# Patient Record
Sex: Female | Born: 1980 | Race: White | Hispanic: No | Marital: Single | Smoking: Current every day smoker
Health system: Southern US, Community
[De-identification: ages and names within clinical notes are randomized; demographics above are authoritative.]

## PROBLEM LIST (undated history)

## (undated) DIAGNOSIS — K811 Chronic cholecystitis: Secondary | ICD-10-CM

## (undated) DIAGNOSIS — F1120 Opioid dependence, uncomplicated: Secondary | ICD-10-CM

## (undated) DIAGNOSIS — Z87440 Personal history of urinary (tract) infections: Secondary | ICD-10-CM

## (undated) DIAGNOSIS — Z809 Family history of malignant neoplasm, unspecified: Secondary | ICD-10-CM

## (undated) DIAGNOSIS — N390 Urinary tract infection, site not specified: Secondary | ICD-10-CM

## (undated) DIAGNOSIS — B379 Candidiasis, unspecified: Secondary | ICD-10-CM

## (undated) DIAGNOSIS — F419 Anxiety disorder, unspecified: Secondary | ICD-10-CM

## (undated) DIAGNOSIS — Z8619 Personal history of other infectious and parasitic diseases: Secondary | ICD-10-CM

## (undated) DIAGNOSIS — R569 Unspecified convulsions: Secondary | ICD-10-CM

## (undated) DIAGNOSIS — Z8249 Family history of ischemic heart disease and other diseases of the circulatory system: Secondary | ICD-10-CM

## (undated) DIAGNOSIS — O139 Gestational [pregnancy-induced] hypertension without significant proteinuria, unspecified trimester: Secondary | ICD-10-CM

## (undated) DIAGNOSIS — F329 Major depressive disorder, single episode, unspecified: Secondary | ICD-10-CM

## (undated) DIAGNOSIS — IMO0002 Reserved for concepts with insufficient information to code with codable children: Secondary | ICD-10-CM

## (undated) DIAGNOSIS — F32A Depression, unspecified: Secondary | ICD-10-CM

## (undated) DIAGNOSIS — N871 Moderate cervical dysplasia: Secondary | ICD-10-CM

## (undated) DIAGNOSIS — O99320 Drug use complicating pregnancy, unspecified trimester: Secondary | ICD-10-CM

## (undated) DIAGNOSIS — F1910 Other psychoactive substance abuse, uncomplicated: Secondary | ICD-10-CM

## (undated) DIAGNOSIS — N870 Mild cervical dysplasia: Secondary | ICD-10-CM

## (undated) DIAGNOSIS — Z833 Family history of diabetes mellitus: Secondary | ICD-10-CM

## (undated) HISTORY — DX: Family history of malignant neoplasm, unspecified: Z80.9

## (undated) HISTORY — DX: Gestational (pregnancy-induced) hypertension without significant proteinuria, unspecified trimester: O13.9

## (undated) HISTORY — DX: Anxiety disorder, unspecified: F41.9

## (undated) HISTORY — DX: Moderate cervical dysplasia: N87.1

## (undated) HISTORY — DX: Depression, unspecified: F32.A

## (undated) HISTORY — DX: Unspecified convulsions: R56.9

## (undated) HISTORY — PX: WISDOM TOOTH EXTRACTION: SHX21

## (undated) HISTORY — DX: Candidiasis, unspecified: B37.9

## (undated) HISTORY — DX: Urinary tract infection, site not specified: N39.0

## (undated) HISTORY — DX: Personal history of urinary (tract) infections: Z87.440

## (undated) HISTORY — DX: Family history of ischemic heart disease and other diseases of the circulatory system: Z82.49

## (undated) HISTORY — DX: Reserved for concepts with insufficient information to code with codable children: IMO0002

## (undated) HISTORY — DX: Personal history of other infectious and parasitic diseases: Z86.19

## (undated) HISTORY — PX: DILATION AND CURETTAGE OF UTERUS: SHX78

## (undated) HISTORY — DX: Major depressive disorder, single episode, unspecified: F32.9

## (undated) HISTORY — DX: Other psychoactive substance abuse, uncomplicated: F19.10

## (undated) HISTORY — DX: Family history of diabetes mellitus: Z83.3

## (undated) HISTORY — DX: Mild cervical dysplasia: N87.0

## (undated) HISTORY — DX: Chronic cholecystitis: K81.1

---
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
## 2003-05-04 ENCOUNTER — Emergency Department (HOSPITAL_COMMUNITY): Admission: EM | Admit: 2003-05-04 | Discharge: 2003-05-04 | Payer: Self-pay | Admitting: Emergency Medicine

## 2003-08-09 DIAGNOSIS — Z8619 Personal history of other infectious and parasitic diseases: Secondary | ICD-10-CM

## 2003-08-09 DIAGNOSIS — O139 Gestational [pregnancy-induced] hypertension without significant proteinuria, unspecified trimester: Secondary | ICD-10-CM

## 2003-08-09 HISTORY — DX: Personal history of other infectious and parasitic diseases: Z86.19

## 2003-08-09 HISTORY — DX: Gestational (pregnancy-induced) hypertension without significant proteinuria, unspecified trimester: O13.9

## 2003-10-30 ENCOUNTER — Other Ambulatory Visit: Admission: RE | Admit: 2003-10-30 | Discharge: 2003-10-30 | Payer: Self-pay | Admitting: Obstetrics and Gynecology

## 2004-04-04 ENCOUNTER — Ambulatory Visit: Payer: Self-pay | Admitting: Obstetrics and Gynecology

## 2004-05-12 ENCOUNTER — Inpatient Hospital Stay (HOSPITAL_COMMUNITY): Admission: AD | Admit: 2004-05-12 | Discharge: 2004-05-16 | Payer: Self-pay | Admitting: *Deleted

## 2004-05-13 ENCOUNTER — Encounter (INDEPENDENT_AMBULATORY_CARE_PROVIDER_SITE_OTHER): Payer: Self-pay | Admitting: Specialist

## 2005-12-05 ENCOUNTER — Emergency Department (HOSPITAL_COMMUNITY): Admission: EM | Admit: 2005-12-05 | Discharge: 2005-12-05 | Payer: Self-pay | Admitting: Emergency Medicine

## 2006-01-27 ENCOUNTER — Other Ambulatory Visit: Admission: RE | Admit: 2006-01-27 | Discharge: 2006-01-27 | Payer: Self-pay | Admitting: Obstetrics and Gynecology

## 2006-02-24 ENCOUNTER — Ambulatory Visit (HOSPITAL_COMMUNITY): Admission: RE | Admit: 2006-02-24 | Discharge: 2006-02-24 | Payer: Self-pay | Admitting: Obstetrics and Gynecology

## 2006-02-24 ENCOUNTER — Encounter (INDEPENDENT_AMBULATORY_CARE_PROVIDER_SITE_OTHER): Payer: Self-pay | Admitting: Specialist

## 2006-08-08 DIAGNOSIS — IMO0002 Reserved for concepts with insufficient information to code with codable children: Secondary | ICD-10-CM

## 2006-08-08 DIAGNOSIS — R87619 Unspecified abnormal cytological findings in specimens from cervix uteri: Secondary | ICD-10-CM

## 2006-08-08 HISTORY — DX: Reserved for concepts with insufficient information to code with codable children: IMO0002

## 2006-08-08 HISTORY — DX: Unspecified abnormal cytological findings in specimens from cervix uteri: R87.619

## 2006-09-14 ENCOUNTER — Inpatient Hospital Stay (HOSPITAL_COMMUNITY): Admission: AD | Admit: 2006-09-14 | Discharge: 2006-09-14 | Payer: Self-pay | Admitting: Obstetrics and Gynecology

## 2006-10-26 ENCOUNTER — Ambulatory Visit (HOSPITAL_COMMUNITY): Admission: RE | Admit: 2006-10-26 | Discharge: 2006-10-26 | Payer: Self-pay | Admitting: Obstetrics and Gynecology

## 2006-11-14 ENCOUNTER — Ambulatory Visit (HOSPITAL_COMMUNITY): Admission: RE | Admit: 2006-11-14 | Discharge: 2006-11-14 | Payer: Self-pay | Admitting: Obstetrics and Gynecology

## 2006-12-13 ENCOUNTER — Ambulatory Visit (HOSPITAL_COMMUNITY): Admission: RE | Admit: 2006-12-13 | Discharge: 2006-12-13 | Payer: Self-pay | Admitting: Obstetrics and Gynecology

## 2007-01-10 ENCOUNTER — Ambulatory Visit (HOSPITAL_COMMUNITY): Admission: RE | Admit: 2007-01-10 | Discharge: 2007-01-10 | Payer: Self-pay | Admitting: Obstetrics and Gynecology

## 2007-02-07 ENCOUNTER — Ambulatory Visit (HOSPITAL_COMMUNITY): Admission: RE | Admit: 2007-02-07 | Discharge: 2007-02-07 | Payer: Self-pay | Admitting: Obstetrics and Gynecology

## 2007-02-28 ENCOUNTER — Inpatient Hospital Stay (HOSPITAL_COMMUNITY): Admission: AD | Admit: 2007-02-28 | Discharge: 2007-03-03 | Payer: Self-pay | Admitting: Obstetrics and Gynecology

## 2007-03-01 ENCOUNTER — Encounter (INDEPENDENT_AMBULATORY_CARE_PROVIDER_SITE_OTHER): Payer: Self-pay | Admitting: Obstetrics and Gynecology

## 2007-03-01 ENCOUNTER — Encounter: Payer: Self-pay | Admitting: Obstetrics and Gynecology

## 2007-06-29 ENCOUNTER — Ambulatory Visit (HOSPITAL_COMMUNITY): Admission: RE | Admit: 2007-06-29 | Discharge: 2007-06-29 | Payer: Self-pay | Admitting: Obstetrics and Gynecology

## 2007-06-29 ENCOUNTER — Encounter (INDEPENDENT_AMBULATORY_CARE_PROVIDER_SITE_OTHER): Payer: Self-pay | Admitting: Obstetrics and Gynecology

## 2007-06-29 HISTORY — PX: LEEP: SHX91

## 2007-09-21 ENCOUNTER — Emergency Department (HOSPITAL_COMMUNITY): Admission: EM | Admit: 2007-09-21 | Discharge: 2007-09-21 | Payer: Self-pay | Admitting: Emergency Medicine

## 2008-06-04 IMAGING — CR DG ABDOMEN 1V
2 series · 2 of 2 positions shown · non-contrast
Comparison: None

CLINICAL DATA: Right sided pain.
 ABDOMEN ? 2 VIEW:

[t abdomen supine (1 of 2)]
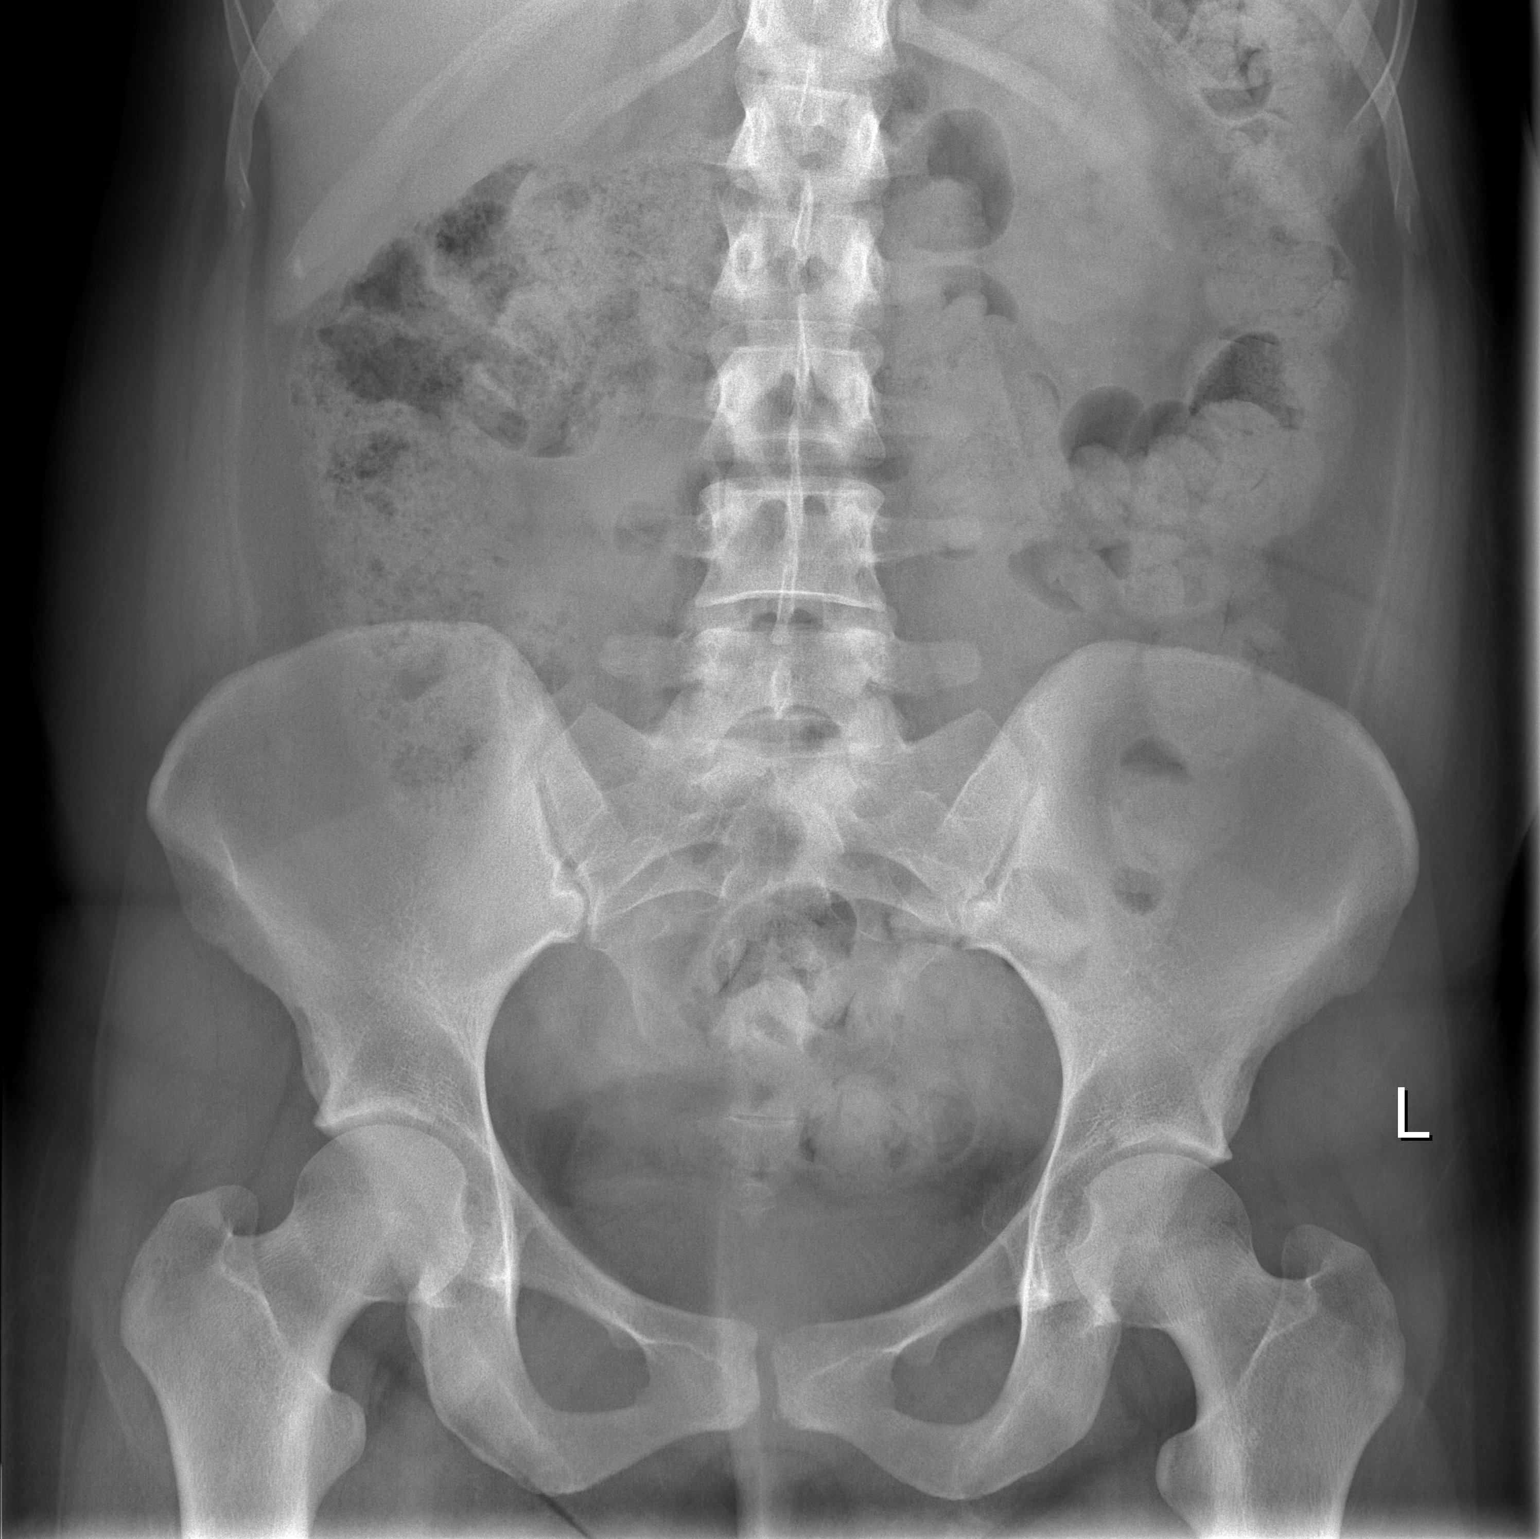

[t abdomen supine (2 of 2)]
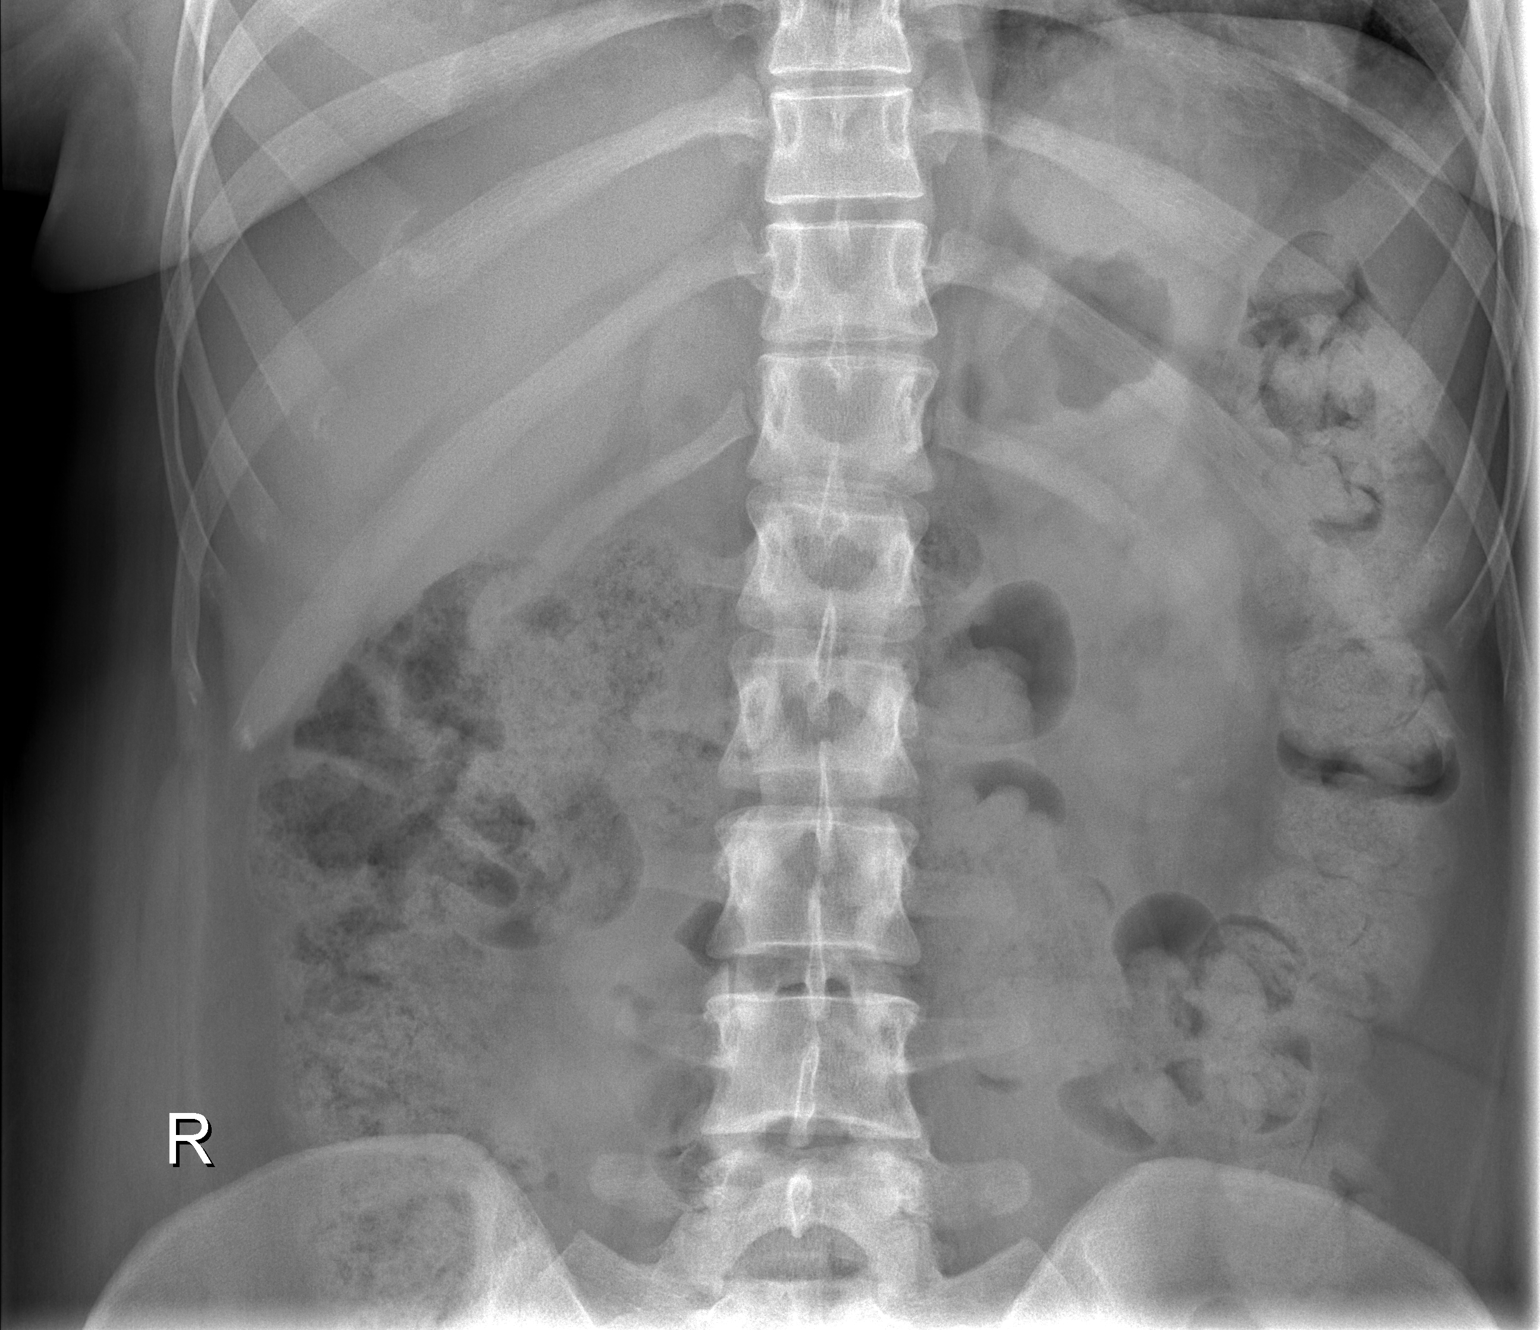

[2 of 2 positions shown; findings below may reference images not displayed]

FINDINGS: Two views of the abdomen show a nonspecific, non-obstructive bowel gas pattern.  Moderate stool noted throughout the colon.
IMPRESSION: Nonspecific, non-obstructive bowel gas pattern.  Colonic stool noted.

## 2010-08-29 ENCOUNTER — Encounter: Payer: Self-pay | Admitting: Obstetrics and Gynecology

## 2010-10-13 ENCOUNTER — Emergency Department (HOSPITAL_COMMUNITY)
Admission: EM | Admit: 2010-10-13 | Discharge: 2010-10-13 | Discharge status: Against Medical Advice | Payer: Medicaid Other | Attending: Emergency Medicine | Admitting: Emergency Medicine

## 2010-10-13 DIAGNOSIS — M79609 Pain in unspecified limb: Secondary | ICD-10-CM | POA: Insufficient documentation

## 2010-10-13 DIAGNOSIS — R350 Frequency of micturition: Secondary | ICD-10-CM | POA: Insufficient documentation

## 2010-10-13 DIAGNOSIS — R109 Unspecified abdominal pain: Secondary | ICD-10-CM | POA: Insufficient documentation

## 2010-10-13 LAB — URINALYSIS, ROUTINE W REFLEX MICROSCOPIC
Glucose, UA: NEGATIVE mg/dL
Hgb urine dipstick: NEGATIVE
Ketones, ur: 15 mg/dL — AB
Nitrite: POSITIVE — AB
Protein, ur: NEGATIVE mg/dL
Specific Gravity, Urine: 1.026 (ref 1.005–1.030)
Urobilinogen, UA: 1 mg/dL (ref 0.0–1.0)
pH: 5.5 (ref 5.0–8.0)

## 2010-10-13 LAB — URINE MICROSCOPIC-ADD ON

## 2010-10-15 ENCOUNTER — Emergency Department (HOSPITAL_COMMUNITY)
Admission: EM | Admit: 2010-10-15 | Discharge: 2010-10-15 | Disposition: A | Discharge status: Home / Self Care | Payer: Medicaid Other | Attending: Emergency Medicine | Admitting: Emergency Medicine

## 2010-10-15 ENCOUNTER — Emergency Department (HOSPITAL_COMMUNITY): Payer: Medicaid Other

## 2010-10-15 DIAGNOSIS — K8020 Calculus of gallbladder without cholecystitis without obstruction: Secondary | ICD-10-CM | POA: Insufficient documentation

## 2010-10-15 DIAGNOSIS — F3289 Other specified depressive episodes: Secondary | ICD-10-CM | POA: Insufficient documentation

## 2010-10-15 DIAGNOSIS — F329 Major depressive disorder, single episode, unspecified: Secondary | ICD-10-CM | POA: Insufficient documentation

## 2010-10-15 DIAGNOSIS — R109 Unspecified abdominal pain: Secondary | ICD-10-CM | POA: Insufficient documentation

## 2010-10-15 DIAGNOSIS — R7989 Other specified abnormal findings of blood chemistry: Secondary | ICD-10-CM | POA: Insufficient documentation

## 2010-10-15 LAB — COMPREHENSIVE METABOLIC PANEL
ALT: 60 U/L — ABNORMAL HIGH (ref 0–35)
AST: 51 U/L — ABNORMAL HIGH (ref 0–37)
Albumin: 4.4 g/dL (ref 3.5–5.2)
Alkaline Phosphatase: 74 U/L (ref 39–117)
BUN: 12 mg/dL (ref 6–23)
CO2: 25 mEq/L (ref 19–32)
Calcium: 9.4 mg/dL (ref 8.4–10.5)
Chloride: 103 mEq/L (ref 96–112)
Creatinine, Ser: 0.75 mg/dL (ref 0.4–1.2)
GFR calc Af Amer: >60 mL/min (ref >60)
GFR calc non Af Amer: >60 mL/min (ref >60)
Glucose, Bld: 98 mg/dL (ref 70–99)
Potassium: 3.3 mEq/L — ABNORMAL LOW (ref 3.5–5.1)
Sodium: 138 mEq/L (ref 135–145)
Total Bilirubin: 0.8 mg/dL (ref 0.3–1.2)
Total Protein: 7.9 g/dL (ref 6.0–8.3)

## 2010-10-15 LAB — URINALYSIS, ROUTINE W REFLEX MICROSCOPIC
Bilirubin Urine: NEGATIVE
Glucose, UA: NEGATIVE mg/dL
Hgb urine dipstick: NEGATIVE
Ketones, ur: NEGATIVE mg/dL
Nitrite: NEGATIVE
Protein, ur: NEGATIVE mg/dL
Specific Gravity, Urine: 1.026 (ref 1.005–1.030)
Urobilinogen, UA: 1 mg/dL (ref 0.0–1.0)
pH: 6.5 (ref 5.0–8.0)

## 2010-10-15 LAB — URINE MICROSCOPIC-ADD ON

## 2010-10-15 LAB — DIFFERENTIAL
Basophils Absolute: 0 10*3/uL (ref 0.0–0.1)
Basophils Relative: 0 % (ref 0–1)
Eosinophils Absolute: 0.1 10*3/uL (ref 0.0–0.7)
Eosinophils Relative: 1 % (ref 0–5)
Lymphocytes Relative: 17 % (ref 12–46)
Lymphs Abs: 1.9 10*3/uL (ref 0.7–4.0)
Monocytes Absolute: 0.6 10*3/uL (ref 0.1–1.0)
Monocytes Relative: 5 % (ref 3–12)
Neutro Abs: 8.3 10*3/uL — ABNORMAL HIGH (ref 1.7–7.7)
Neutrophils Relative %: 76 % (ref 43–77)

## 2010-10-15 LAB — CBC
HCT: 43.1 % (ref 36.0–46.0)
Hemoglobin: 14.6 g/dL (ref 12.0–15.0)
MCH: 29.5 pg (ref 26.0–34.0)
MCHC: 33.9 g/dL (ref 30.0–36.0)
MCV: 87.1 fL (ref 78.0–100.0)
Platelets: 207 10*3/uL (ref 150–400)
RBC: 4.95 MIL/uL (ref 3.87–5.11)
RDW: 12.3 % (ref 11.5–15.5)
WBC: 10.8 10*3/uL — ABNORMAL HIGH (ref 4.0–10.5)

## 2010-10-15 LAB — PREGNANCY, URINE: Preg Test, Ur: NEGATIVE

## 2010-11-04 ENCOUNTER — Encounter (HOSPITAL_COMMUNITY)
Admission: RE | Admit: 2010-11-04 | Discharge: 2010-11-04 | Disposition: A | Discharge status: Home / Self Care | Payer: Medicaid Other | Source: Ambulatory Visit | Attending: General Surgery | Admitting: General Surgery

## 2010-11-04 LAB — CBC
HCT: 40.6 % (ref 36.0–46.0)
Hemoglobin: 13.8 g/dL (ref 12.0–15.0)
MCH: 29.6 pg (ref 26.0–34.0)
MCHC: 34 g/dL (ref 30.0–36.0)
MCV: 87.1 fL (ref 78.0–100.0)
RDW: 13 % (ref 11.5–15.5)

## 2010-11-04 LAB — URINALYSIS, ROUTINE W REFLEX MICROSCOPIC
Glucose, UA: NEGATIVE mg/dL
Hgb urine dipstick: NEGATIVE
Ketones, ur: 15 mg/dL — AB
pH: 6 (ref 5.0–8.0)

## 2010-11-04 LAB — SURGICAL PCR SCREEN
MRSA, PCR: NEGATIVE
Staphylococcus aureus: NEGATIVE

## 2010-11-04 LAB — COMPREHENSIVE METABOLIC PANEL
ALT: 23 U/L (ref 0–35)
Albumin: 3.9 g/dL (ref 3.5–5.2)
Calcium: 9.3 mg/dL (ref 8.4–10.5)
Glucose, Bld: 91 mg/dL (ref 70–99)
Sodium: 139 mEq/L (ref 135–145)
Total Protein: 7.5 g/dL (ref 6.0–8.3)

## 2010-11-04 LAB — URINE MICROSCOPIC-ADD ON

## 2010-11-04 LAB — DIFFERENTIAL
Eosinophils Relative: 3 % (ref 0–5)
Lymphocytes Relative: 40 % (ref 12–46)
Monocytes Absolute: 0.4 10*3/uL (ref 0.1–1.0)
Monocytes Relative: 5 % (ref 3–12)
Neutro Abs: 4 10*3/uL (ref 1.7–7.7)

## 2010-11-07 DIAGNOSIS — K811 Chronic cholecystitis: Secondary | ICD-10-CM

## 2010-11-07 HISTORY — DX: Chronic cholecystitis: K81.1

## 2010-11-09 ENCOUNTER — Ambulatory Visit (HOSPITAL_COMMUNITY)
Admission: RE | Admit: 2010-11-09 | Discharge: 2010-11-09 | Disposition: A | Discharge status: Home / Self Care | Payer: Medicaid Other | Source: Ambulatory Visit | Attending: General Surgery | Admitting: General Surgery

## 2010-11-09 ENCOUNTER — Other Ambulatory Visit: Payer: Self-pay | Admitting: General Surgery

## 2010-11-09 ENCOUNTER — Ambulatory Visit (HOSPITAL_COMMUNITY): Payer: Medicaid Other

## 2010-11-09 DIAGNOSIS — K8010 Calculus of gallbladder with chronic cholecystitis without obstruction: Secondary | ICD-10-CM | POA: Insufficient documentation

## 2010-11-09 DIAGNOSIS — Z01812 Encounter for preprocedural laboratory examination: Secondary | ICD-10-CM | POA: Insufficient documentation

## 2010-11-09 HISTORY — PX: LAPAROSCOPIC CHOLECYSTECTOMY W/ CHOLANGIOGRAPHY: SUR757

## 2010-11-16 NOTE — Op Note (Signed)
NAMESHARNICE, BOSLER              ACCOUNT NO.:  [id number]  MEDICAL RECORD NO.:  [id number]           PATIENT TYPE:  O  LOCATION:  SDSC                         FACILITY:  [facility]  PHYSICIAN:  Angelia Mould. Derrell Lolling, M.D.DATE OF BIRTH:  22-Oct-1980  DATE OF PROCEDURE:  11/09/2010 DATE OF DISCHARGE:  11/09/2010                              OPERATIVE REPORT   PREOPERATIVE DIAGNOSIS:  Chronic cholecystitis with cholelithiasis.  POSTOPERATIVE DIAGNOSIS:  Chronic cholecystitis with cholelithiasis.  OPERATION PERFORMED AT:  Laparoscopic cholecystectomy with intraoperative cholangiogram.  SURGEON:  Angelia Mould. Derrell Lolling, MD  FIRST ASSISTANT:  Currie Paris, MD  OPERATIVE INDICATIONS:  This is a 30 year old Caucasian female who began having episodes of biliary colic about a month ago.  This is described as right upper quadrant pain, right flank pain, right shoulder pain with associated nausea and vomiting.  It has been intermittent.  Recent ultrasound was performed at [facility], which showed multiple stones of the gallbladder, but no evidence of inflammation and the common bile duct measuring only 3.9 mm.  Liver function test showed mild elevation of SGPT and alkaline phosphatase.  Urine pregnancy test is negative.  She has been evaluated as an outpatient.  She really has not had any attacks for the past 10 days and is brought to the operating room electively.  OPERATIVE FINDINGS:  The gallbladder was chronically inflamed.  There were adhesions to the body and infundibulum of the gallbladder.  There was no evidence of any acute inflammation and the wall was really not that thick.  The anatomy of the cystic duct, cystic artery, and common bile duct were conventional.  The cholangiogram was normal, showing normal intrahepatic and extrahepatic biliary anatomy, no filling defects, no dilatation, and no obstruction with good flow of contrast into the duodenum.  The liver,  stomach, duodenum, small intestine, and large intestine were normal to inspection.  OPERATIVE TECHNIQUE:  Following the induction of general endotracheal anesthesia, the patient's abdomen was prepped and draped in sterile fashion.  Intravenous antibiotics were given.  Surgical time-out was held identifying correct patient and correct procedure.  Marcaine 0.5% with epinephrine were used as a local infiltration anesthetic.  A vertically-oriented incision was made at the lower rim of the umbilicus. The fascia was incised in the midline and the abdominal cavity entered under direct vision.  An 11-mm Hassan trocar was inserted and secured with pursestring suture of 0-Vicryl.  Pneumoperitoneum was created. Video cam was inserted with visualization and survey as findings above. An 11-mm trocar was placed in the subxiphoid region and two 5-mm trocars were placed in the right upper quadrant.  The gallbladder fundus was elevated.  Adhesions were taken down slowly.  We dissected out the cystic duct and the cystic artery.  The cystic artery was isolated as it went onto the wall of the gallbladder, was secured with multiple metal clips and divided.  A cholangiogram catheter was inserted in the cystic duct and a cholangiogram was obtained using the C-arm.  The cholangiogram was normal as described above.  The cholangiogram catheter was removed and the cystic duct was secured with multiple metal  clips and divided.  The gallbladder was dissected from its bed with electrocautery.  One small hole was made in the gallbladder and some green bile was spilled, but no stones were spilled.  This gallbladder was placed in the specimen bag and removed.  We irrigated the subphrenic space right paracolic gutter fairly extensively.  At the completion of the case, the irrigation fluid was completely clear.  There was no bleeding or bile leak whatsoever.  The trocars were removed under direct vision, and there was no  bleeding from the trocar sites.  The pneumoperitoneum was released.  The fascia at the umbilicus was closed with 0-Vicryl sutures.  The skin incisions were closed with subcuticular sutures of 4-0 Monocryl and Dermabond.  The patient tolerated the procedure well and was taken to the recovery room in a stable condition. Estimated blood loss was about 10 mL.  Complications were none.  Sponge, needle, and instrument counts were correct.     Angelia Mould. Derrell Lolling, M.D.     HMI/MEDQ  D:  11/09/2010  T:  11/10/2010  Job:  [id number]  cc:   Windle Guard, M.D. Junious Dresser, MD Osborn Coho, M.D.  Electronically Signed by Claud Kelp M.D. on 11/16/2010 06:45:27 AM

## 2010-12-10 ENCOUNTER — Encounter (INDEPENDENT_AMBULATORY_CARE_PROVIDER_SITE_OTHER): Payer: Self-pay | Admitting: General Surgery

## 2010-12-21 NOTE — Op Note (Signed)
NAME:  Jean [name], Jean [name]              ACCOUNT NO.:  [id number]   MEDICAL RECORD NO.:  [id number]          PATIENT TYPE:  OUT   LOCATION:  MFM                           [facility]   PHYSICIAN:  Hal Morales, M.D.DATE OF BIRTH:  [birth year]   DATE OF PROCEDURE:  03/01/2007  DATE OF DISCHARGE:                               OPERATIVE REPORT   PREOPERATIVE DIAGNOSES:  Intrauterine pregnancy at 19 weeks' gestation,  mature LS and PG, prior cesarean section with desire for repeat cesarean  section, nonreassuring antepartum testing, umbilical nevus, methadone  maintenance.   POSTOPERATIVE DIAGNOSES:  Intrauterine pregnancy at 39 weeks' gestation,  mature LS and PG, prior cesarean section with desire for repeat cesarean  section, nonreassuring antepartum testing, umbilical nevus, methadone  maintenance.  Plus occult cord prolapse.   PROCEDURE:  Repeat low transverse cesarean section and removal of  umbilical nevus.   SURGEON:  Dr. Dierdre Forth   FIRST ASSISTANT:  Erin Sons, certified nurse midwife.   ANESTHESIA:  Spinal.   ESTIMATED BLOOD LOSS:  750 mL.   COMPLICATIONS:  None.   FINDINGS:  The uterus, tubes and ovaries were normal for the gravid  state.  The placenta contained an eccentrically inserted three-vessel  cord.   FINDINGS:  The patient was delivered of a female infant name, Gearldine Bienenstock,  weighing 6 pounds 7 ounces with Apgars of 9 and 9 at 1 and 5 minutes  respectively.   PROCEDURE:  The patient is taken to the operating room after appropriate  identification and placed on the operating table.  After the attainment  of adequate spinal anesthesia she was placed in the supine position with  left lateral tilt.  The abdomen and perineum were prepped multiple  layers of Betadine and a Foley catheter inserted into the bladder under  sterile conditions and connected to straight drainage.  The abdomen was  draped as a sterile field.  A solution of quarter percent  Marcaine was  injected at the site of the previous cesarean section incision for total  of 20 mL.  A transverse incision was made at the site of the previous  cesarean section incision and the abdomen opened in layers.  Peritoneum  was entered and the bladder blade placed.  The uterus was incised  approximately 2 cm above the uterovesical fold and that incision taken  laterally on either side bluntly.  The infant was delivered from the  occiput transverse position and after having nares and pharynx  suctioned.  The cord clamped and cut was handed off to the awaiting  pediatricians.  The appropriate cord blood was drawn.  Placenta was  noted to have separated from the uterus and was removed from the  operative field.  The uterine incision was closed with a running  interlocking suture of 0 Vicryl and imbricating suture of 0 Vicryl was  placed.  Hemostasis was achieved with several figure-of-eight sutures of  0 Vicryl.  Copious irrigation was carried out.  The abdominal peritoneum  was closed with a running suture of 2-0 Vicryl.  The rectus muscles were  reapproximated in the midline  with figure-of-eight suture of 2-0 Vicryl.  The rectus fascia was closed with a running suture of 0-0 Vicryl and  reinforced on either side of midline with figure-of-eight sutures of 0  Vicryl.  The subcutaneous tissue was made hemostatic with Bovie cautery  and irrigated.  Skin incision was closed with a subcuticular suture of 3-  0 Monocryl.  The umbilical nevus was then grasped and elevated and a  suture of 2-0 Vicryl tied around the base.  The nevus was excised.  Steri-Strips were applied to the skin incision.  A sterile dressing  applied.  The patient was taken from the operating room to the recovery  room in satisfactory condition having tolerated the procedure well with  sponge and instrument counts correct.   SPECIMENS TO PATHOLOGY:  Placenta and nevus.      Hal Morales, M.D.  Electronically  Signed     VPH/MEDQ  D:  03/01/2007  T:  03/02/2007  Job:  [id number]

## 2010-12-21 NOTE — Discharge Summary (Signed)
Jean [name], Jean [name]              ACCOUNT NO.:  [id number]   MEDICAL RECORD NO.:  [id number]          PATIENT TYPE:  INP   LOCATION:  9145                          [facility]ILITY[facility]  [facility]   PHYSICIAN:  Crist Fat. Rivard, M.D. DATE OF BIRTH:  [birth year]   DATE OF ADMISSION:  02/28/2007  DATE OF DISCHARGE:  03/03/2007                               DISCHARGE SUMMARY   ADMISSION DIAGNOSES:  1. Intrauterine pregnancy at 35-5/7 weeks.  2. Nonreactive NST with BPP 6/8 for a total of 6/10.  3. 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  4. Previous cesarean section with plan for repeat as method of      delivery.   DISCHARGE DIAGNOSES:  1. Intrauterine pregnancy at [redacted] weeks gestation with mature LSNPG.  2. Prior cesarean section with desire for repeat cesarean section.  3. None reassuring antepartum testing.  4. Methadone maintenance.  5. Occult cord prolapse.   PROCEDURE:  Repeat low-transverse cesarean section and removal of  umbilical nevus.   HOSPITAL COURSE:  Jean [name] has a 30 year old Gravida 3, Para 1-0-1-1  who was admitted at 35-5/7 weeks from the office secondary to a  nonreactive NST, and BPP 6/8 without fetal breathing movement.  She was  reporting decreased fetal movement in the office; therefore, she was  sent to the hospital for observation and repeat of  the BPP on the  evening of February 28, 2007. The pregnancy has been followed by the [facility] service and has been remarkable for:  1. 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  2. Previous cesarean section with plan for repeat.  3. History of preeclampsia with first pregnancy.  4. History of seizure x1 in the past.  5. Depression on Zoloft 50 mg daily.  6. History of fetus with cystic hygroma with fetal demise in the first      trimester.  7. Human papillomavirus lesions.   The patient was initially  admitted for 23-hour observation to the  antenatal unit.  BPP on the evening of July 23 showed 6/8 with no fetal  breathing motions.  AFI was normal at 17.68 cm.  Electronic fetal  monitoring remained reassuring; and baby was active per patient.  Options were reviewed with the patient including amniocentesis for  possible early delivery versus repeating a BPP and possibly sending her  home with plans for continued antenatal testing.  After discussion with  maternal fetal medicine, it was recommended that the patient undergo  amniocentesis for lung maturity, and delivery if mature.  The patient  consented to the procedure which was performed on the morning of March 01, 2007.  Results of the procedure showed PG positive.  LS ratio 3:1  and the decision was made to proceed with cesarean section.   The patient tolerated the procedure well.  She underwent a repeat low-  transverse cesarean section, as well as removal of umbilical nevus.  Infant was a viable female named Gearldine Bienenstock, weighing 6 pounds and  7 ounces  with Apgars of 9 and 9.  Infant was taken to the central nursery in good  condition.  The patient tolerated the rest of the procedure, and was  taken to the recovery room and was doing well.   By postop day #1 she was feeling well, reporting that pain was well  controlled.  She was bottle feeding, hemoglobin was 11.2 and had been  12.6 preoperatively.  During the night of postop day #1 she required a  couple of doses of Percocet for pain management.  She reported that the  Darvocet and Dilaudid were not managing her pain well.  However, by the  morning of postop day #2 she was desiring to go home.  Infant had been  discharged already.  Patient felt that the combination of Motrin and  Dilaudid would be sufficient for pain management at home.   She was deemed to have received the full benefit of her hospital stay.   DISCHARGE INSTRUCTIONS:  Per [facility] handout.   DISCHARGE  MEDICATIONS:  1. Motrin 600 mg one p.o. q.6 h. around-the-clock x5 days and then      p.r.n.  2. Dilaudid 2 mg one p.o. q.6 h. p.r.n. pain, dispensed #40 with no      refills.   DISCHARGE FOLLOWUP:  Will occur at [facility] in 4-6 weeks  or as needed.      Cam Hai, C.N.M.      Crist Fat Rivard, M.D.  Electronically Signed    KS/MEDQ  D:  03/03/2007  T:  03/04/2007  Job:  [id number]

## 2010-12-21 NOTE — Op Note (Signed)
Jean [name], Jean [name]              ACCOUNT NO.:  [id number]   MEDICAL RECORD NO.:  [id number]          PATIENT TYPE:  AMB   LOCATION:  SDC                           [facility]   PHYSICIAN:  Osborn Coho, M.D.   DATE OF BIRTH:  [birth year]   DATE OF PROCEDURE:  06/29/2007  DATE OF DISCHARGE:                               OPERATIVE REPORT   PREOPERATIVE DIAGNOSIS:  Cervical intraepithelial neoplasia 1 and 2.   POSTOPERATIVE DIAGNOSIS:  Cervical intraepithelial neoplasia 1 and 2.   PROCEDURE:  LEEP.   ATTENDING SURGEON:  Osborn Coho, M.D.   ANESTHESIA:  General via LMA.   SPECIMENS TO PATHOLOGY:  Cervical LEEP specimen.   FINDINGS:  Acetowhite lesion at 10 to 2 o'clock and 5 and 6 o'clock.   FLUIDS:  300 mL.   URINE OUTPUT:  Voided prior to procedure.   ESTIMATED BLOOD LOSS:  Minimal.   COMPLICATIONS:  None.   PROCEDURE:  The patient was taken to the operating room after the risks,  benefits, and alternatives were reviewed with the patient.  The patient  verbalized understanding and consent signed and witnessed.  The patient  was placed under general per anesthesia and prepped and draped in a  normal sterile fashion.   A bivalve speculum was placed in the patient's vagina and acetic acid  used to prep the cervix.  Acetowhite lesions were as noted above.  A  size 10-mm x 10-mm loop was used and LEEP specimen obtained.  Ball  cautery was used as well and good hemostasis noted.  Monsel's solution  was placed as well.  On the right vaginal wall, the loop touched the  vagina, and there was some bleeding at that area which was made  hemostatic with stitches of 3-0 Vicryl,  a running interlocking stitch.  Good hemostasis was noted.  A  paracervical block was administered using a total of 10 mL of 1%  lidocaine.  Count was correct.   The patient tolerated the procedure well and was awaiting transfer to  the recovery room in good condition.      Osborn Coho,  M.D.  Electronically Signed     AR/MEDQ  D:  06/29/2007  T:  06/29/2007  Job:  [id number]

## 2010-12-21 NOTE — H&P (Signed)
Jean [name], Jean [name]              ACCOUNT NO.:  [id number]   MEDICAL RECORD NO.:  [id number]          PATIENT TYPE:  INP   LOCATION:  9157                          [facility]   PHYSICIAN:  Naima A. Dillard, M.D. DATE OF BIRTH:  [birth year]   DATE OF ADMISSION:  02/28/2007  DATE OF DISCHARGE:                              HISTORY & PHYSICAL   HISTORY OF PRESENT ILLNESS:  [name] is a 30 year old gravida 3, para  1-0-1-1 at 35-5/7 weeks.  She presented from the office secondary to a  nonreactive NST and a BPP of 6 out of 8 without fetal breathing  movement.  She had reported decreased fetal movement today in the  office.  I placed her on the NST.  While at the office, fetal heart rate  was noted to be nonreactive.  BPP was 6 out of 8 with no fetal breathing  movements, giving a total of 6 out of 10 for antenatal testing.  She is  therefore to be admitted for observation and repeat BPP later the  evening of  February 28, 2007.  The patient pregnancy has been remarkable  for;  1. Being on methadone maintenance secondary to previous OxyContin      dependence.  Current dose is 180 mg p.o. daily. of methadone.  2. Previous cesarean section with plan for repeat on March 22, 2007.  3. History of pre-eclampsia with last pregnancy.  4. History of seizure x1 in the past, possibly secondary to OxyContin      abuse.  5. Depression, on Zoloft 50 mg daily.  6. History of fetus with cystic hygroma with fetal demise in the first      trimester and a D&E.  7. HPV lesions.  8. Abdominal itching, third trimester.   PRENATAL LABS:  Blood type is O-positive.  RH antibody negative.  VDRL  nonreactive.  Rubella titer positive.  Hepatitis-B surface antigen  negative.  HIV nonreactive in the first trimester.  Group B Strep  culture, GC and Chlamydia cultures were done in the office today.  The  patient had first trimester screening secondary to history of a fetus  with a cystic hygroma.  First trimester  screening was normal.  AFP was  done and was also normal.  The patient had a Glucola at 27 weeks with  normal findings.  She had a CBC, comprehensive metabolic panel and a 24  hour urine done during her pregnancy, secondary to her previous history  of pre-eclampsia; these were normal.  A 24 hour urine at 34 weeks showed  155 milligrams of protein.   HISTORY OF PRESENT PREGNANCY:  The patient entered care at approximately  8-10 weeks.  She had a first trimester ultrasound showing dating  criteria with [facility] of March 30, 2007.  She was already in drug rehab for  three months, secondary to methadone use for history of OxyContin  addiction.  she was on Zofran for severe nausea and vomiting.  She had a  first trimester screen which was normal secondary to history of fetus  with cystic hygroma.  She had maternal-fetal consult  for history of  cystic hygroma, questionable fetal sex of current medications and plan  of care.  From the very beginning of her pregnancy she did desire a  repeat C-section.  She had normal Glucola.  Did have some increased  weight gain during her pregnancy.  She began to use Phenergan every day  for chronic nausea.  She had a normal ultrasound at 18-20 weeks.  She  had normal AFP.  She had normal Glucola.  Good growth was noted on  ultrasound at 29 weeks.  She began to have some itching at approximately  33-34 weeks  She had another ultrasound at 34 weeks, showing good growth  with 66 percentile.  She had 1+ protein on the voided specimen at 34  weeks.  A 24 hour urine and PH labs were done; these were negative with  24 hour urine of 165 milligrams.  Bile salts were done secondary to  itching; these were negative.  She was seen in the office at 35 weeks  with a note of decreased fetal movement.  Her blood pressure was 128/80.  NST showed nonreactivity and a BPP was 6 out of 8 with no breathing  movements.  Therefore she was sent to [facility] for observation.  She  also had some new lesions on her vulva.  Genital warts were noted on  the right vulva and periclitoral area.  Decision was made to observe for  spontaneous regression postpartum.   OBSTETRICAL HISTORY:  In 2005 the patient had a primary low transverse  cesarean section for a female infant at [redacted] weeks gestation.  The baby  weighed 6 pounds, 7 ounces.  She had pre-eclampsia during that pregnancy  and was on magnesium sulfate. She progressed to 5-6 centimeters and had  a cord prolapse and meconium stained fluid.  She had epidural anesthesia  with that labor.  In July, 2007, she had a first trimester loss of a  fetus with a cystic hygroma.  She did have a D&E.   PAST MEDICAL HISTORY:  She had Chlamydia in 2005.  She has a history of  pyelo in the past.  She had a seizure in January, 2007; there was some  question as whether this was due to her OxyContin abuse.  She has  entries at [facility] for depression.  She has been on  Zoloft.  She also had been in drug rehab for approximately three months  prior to pregnancy for OxyContin addiction and was on methadone.   ALLERGIES:  The patient has no known medication allergies.   FAMILY HISTORY:  Her mother has hypertension.  Her maternal grandmother  has varicosities.  Her maternal grandmother has diabetes.   PAST SURGICAL HISTORY:  1. Previously noted C-section on 2005.  2. D&E in 2007.   GENETIC HISTORY:  Remarkable for her previous fetus with cystic hygroma.   SOCIAL HISTORY:  The patient is single.  The father of the baby has been  involved during this pregnancy; he is not presently with her today.  The  patient is Caucasian.  She is unemployed.  She lives with her mother.  She denies any alcohol or tobacco use during this pregnancy.  She has  been on methadone for at least three months prior to the onset of  pregnancy and now is on 180 milligrams p.o. daily.   PHYSICAL EXAMINATION:  VITAL SIGNS:  Vital signs are stable.   Patient is  afebrile.  HEENT:  Within normal limits.  LUNGS;  Breath sounds are clear.  HEART:  Regular rate and rhythm without murmur.  BREASTS:  Soft and nontender.  ABDOMEN:  Fundal height is approximately 38 centimeters.  Abdomen is  soft and nontender.  Fetus is in vertex presentation.  Fetal heart rate  initially on presentation was reassuring, however not classically  reactive.  There were cycles of reactivity, but there were also sporadic  mild variables at times.  There were some irregular mild uterine  contractions again initially on admission; these have now subsided.  The  patient was unaware of most of them.  Her abdomen was gravid and there  were scratch marks noted from patient excoriation, but it was soft and  nontender.  PELVIC EXAM:  Deferred at the hospital but HPV lesions were noted on the  right vulvar and periclitoral area while the patient was in the office.  EXTREMITIES:  Deep tendon reflexes are 2+ without clonus.  There is  trace edema noted.   IMPRESSION:  1. Intrauterine pregnancy at 35-5/7 weeks.  2. Nonreactive NST in the office with BPP of 6 out of 8 for a total of      6 out of 10.  3. 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  4. Previous cesarean section with plan for repeat at term.   PLAN:  1. Admit for 23 hour observation per Dr. Normand Sloop and Dr. Pennie Rushing.  2. Continue electronic fetal monitoring.  3. We are to repeat the BPP at approximately 7:00 p.m., which would be      at least six hours after the initial BPP in the office.  4. IV hydration.  5. Possible amnio is a potential plan, depending on fetal heart rate      tracings, however M.D.'s will determine if this is necessary.  6. M.D.'s will follow      Chip Boer L. Emilee Hero, C.N.M.      Naima A. Normand Sloop, M.D.  Electronically Signed    VLL/MEDQ  D:  03/01/2007  T:  03/01/2007  Job:  [id number]   cc:   Naima A. Normand Sloop, M.D.  Fax: [phone]

## 2010-12-21 NOTE — Op Note (Signed)
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   OPERATION:  Amniocentesis, limited ultrasound.   ANESTHESIA:  Local.   ESTIMATED BLOOD LOSS:  Less than 10 mL.   COMPLICATIONS:  None.   FINDINGS:  The amniotic fluid level was considered normal.  There is  vertex presentation.   SPECIMENS TO PATHOLOGY:  10 mL clear amniotic fluid sent to [facility] for LS and PG.   DESCRIPTION OF PROCEDURE:  The patient was in the supine position on her  hospital bed.  The fetal heart rate had been reactive prior to beginning  the amniocentesis.  The ultrasound was used to identify a pocket of  fluid that was near the infant's feet and located close to the umbilicus  approximately 3 cm to the left.  This area was cleansed with multiple  layers of Betadine and draped as a sterile field.  The skin was  infiltrated with 1% Xylocaine for total of 1 mL.  A 20-gauge spinal  needle was then used to access the amniotic fluid pocket and 10 mL of  clear amniotic fluid were withdrawn.  The needle was removed and the  patient placed on the fetal monitor for nonstress testing.  The fluid  was sent to laboratory for transport  to [facility].  The patient tolerated the procedure well.      Hal Morales, M.D.  Electronically Signed     VPH/MEDQ  D:  03/01/2007  T:  03/01/2007  Job:  [id number]

## 2010-12-24 NOTE — H&P (Signed)
NAMESHALECE, STAFFA              ACCOUNT NO.:  [id number]   MEDICAL RECORD NO.:  [id number]          PATIENT TYPE:  INP   LOCATION:  9165                          [facility]   PHYSICIAN:  Hal Morales, M.D.DATE OF BIRTH:  08/14/1980   DATE OF ADMISSION:  05/12/2004  DATE OF DISCHARGE:                                HISTORY & PHYSICAL   HISTORY OF PRESENT ILLNESS:  Ms. Hornung is a 30 year old gravida 1, para  0  at 40-1/7 weeks, EDD May 11, 2004, by date, confirmed with ultrasound at  10 weeks.  She presents from the office of [facility] with elevated blood  pressures and 3+ protein.  She reports positive fetal movement, no rupture  of membranes.  She noticed a bloody show at approximately 4 a.m. this  morning and has been having mild contractions since then which have become  stronger and more regular.  She is now contracting every 2-3 minutes.  She  has no active bleeding.  She denies any PIH symptoms.  No headaches, visual  changes, or epigastric pain.  Her pregnancy has been followed by the CNM  service at [facility] and is remarkable for:  1.  ?LMP.  2.  Smoker.  3.  History of anxiety.  4.  First trimester Chlamydia, test of cure was negative.  GC and Chlamydia      are negative at 36 weeks.  Group B Strep is negative at 36 weeks.  This patient began care at [facility] on October 08, 2003 at approximately nine  weeks' gestation, [facility] determined by 10 week, two day ultrasound and  confirmed with follow-up ultrasound.  She was identified as having Chlamydia  in the first trimester which was treated with Zithromax.  Test of cure three  weeks following treatment was negative and repeat of GC and Chlamydia  cultures at 36 weeks were both negative.  The patient was counseled on  smoking cessation; however, she has smoked throughout her pregnancy and was  taking Paxil at the beginning of pregnancy and switched to Zoloft which she  has continued and also has continued counseling at [facility].  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  Otherwise she has  been normotensive with no proteinuria until evaluation today on May 12, 2004.   OB HISTORY:  Present pregnancy.   PAST MEDICAL HISTORY:  Remarkable for anxiety.  She has been treated with  Paxil and currently is taking Zoloft.  She has been seen for counseling at  [facility].   FAMILY HISTORY:  The patient's mother has a history of hypertension on  medications.  Maternal grandmother, history of varicose veins.  Maternal  grandmother, diabetes.  The patient's paternal uncle has kidney disease.   GENETIC HISTORY:  Father of the baby's sister born with a hole in her heart,  although no medical treatment was necessary.   SOCIAL HISTORY:  Ms. Peragine is a 30 year old single  Caucasian female.  The  father of the baby, Flint Melter, is not involved.  The patient does not  subscribe to a religious faith.  Her mother is present with her here in  labor.   LABORATORY DATA:  Prenatal lab work on October 30, 2003:  Hemoglobin and  hematocrit 13.4 and 37.8, platelets 265,000, blood type and Rh O positive,  antibody screen negative.  VDRL nonreactive.  Rubella immune.  Hepatitis B  surface antigen negative.  HIV nonreactive.  CF testing negative.  Positive  Chlamydia in the first trimester treated with Zithromax.  Test of cure was  negative.  At 28 weeks one hour glucose challenge 91 and hemoglobin 11.8.  At 36 weeks culture of the vaginal tract is negative for GC and Chlamydia  and negative for group B Strep.   Lab work on May 12, 2004 finds 30 mg/dl of protein on a cath urine  specimen.  CBC finds wbc's 13.6, hemoglobin 14.1, hematocrit 41.2, and  platelets 198,000.  Chemistry finds abnormals to be alkaline phosphatase  223, SGOT 40, SGPT  14.  Albumin is 3.4, uric acid is 5, LDH 304.  The  patient's blood pressures since admission to maternity admissions have been  158/110, 150/110, 148/102, 145/97, and 138/103.   ALLERGIES:  The patient has no known drug allergies and she denies the use  of alcohol or illicit drugs.   REVIEW OF SYSTEMS:  The patient is term in her pregnancy and is to be  admitted with preeclampsia.   PHYSICAL EXAMINATION:  VITAL SIGNS:  Blood pressures since admission to  maternity admissions have been 158/110, 150/110, 148/102, 145/97, and  138/103.  The patient is afebrile, temperature 98.6, pulse 120s, respiration  20.  Lab work as stated above.  HEENT:  Unremarkable.  HEART:  Regular rate and rhythm.  LUNGS:  Clear bilaterally to auscultation.  ABDOMEN:  Fetal heart rate is 150s to 160s with average long term  variability.  Fetal heart rate is nonreactive with mild variable  decelerations.  The patient is contracting every 2-3 minutes.  Digital exam  of the cervix in the office of [facility] is 1 cm dilated, 70% effaced, with  vertex at a -1 station, and membranes intact.  EXTREMITIES:  Show 1+ edema.  DTRs are brisk, 2+, with no clonus.   ASSESSMENT:  1.  Intrauterine pregnancy at term.  2.  Preeclampsia.   PLAN:  Admit for Dr. Dierdre Forth.  Start magnesium sulfate, 4 g bolus,  then 2 g per hour.  Reevaluate cervix when admitted to determine need for  Pitocin.  Further orders as written per M.D.      SDM/MEDQ  D:  05/12/2004  T:  05/12/2004  Job:  [id number]

## 2010-12-24 NOTE — Discharge Summary (Signed)
NAME:  Jean [name]              ACCOUNT NO.:  [id number]   MEDICAL RECORD NO.:  [id number]          PATIENT TYPE:  INP   LOCATION:  9147                          [facility]  [facility]   PHYSICIAN:  Naima A. Dillard, M.D. DATE OF BIRTH:  [birth year]   DATE OF ADMISSION:  05/12/2004  DATE OF DISCHARGE:  05/16/2004                                 DISCHARGE SUMMARY   ADMITTING DIAGNOSES:  1.  Intrauterine pregnancy at term.  2.  Preeclampsia.   PREOPERATIVE DIAGNOSES:  1.  Term intrauterine pregnancy.  2.  Meconium stained amniotic fluid.  3.  Preeclampsia.  4.  Prolapsed cord.   PROCEDURE:  Stat primary low transverse cesarean section.   DISCHARGE DIAGNOSES:  1.  Term intrauterine pregnancy.  2.  Meconium stained amniotic fluid.  3.  Preeclampsia, stable.  4.  Prolapsed cord.   Jean [name] is a 30 year old, gravida 1, para 0, who presented at 40-1/7 weeks  to the office of [facility] where it was noted that her blood pressure was  elevated, and she had proteinuria.  She was sent to [facility] for a  full PIH work-up which did identify the patient as having preeclampsia.  She  was started on magnesium sulfate with a 4 g bolus and then 2 g/hr.  Dr.  Dierdre Forth evaluated the patient, rupture of membranes found thick,  meconium stained amniotic fluid.  The patient began amnio infusion; however,  the fetal heart rate had decreased variability, occasional decelerations  with late return.  Dr. Pennie Rushing was digitally examining the patient, and  cervix was found to be advanced to 5-6 cm, but cord was palpable at the site  of the head with release of fluid from the amnioinfusion.  Therefore, a stat  C-section was called for prolapsed cord.  This was performed by Dr. Dierdre Forth with the birth of a 6 pound 10 ounce female infant, named Jean [name],  with Apgar scores of 8 at one minute 9 at five minutes.   The patient's postop course, she was maintained on magnesium sulfate for 24  hours.   Her blood pressures have remained elevated 140s-150s over 90s-100s.  She was started on labetalol, and that was increased to 200 mg b.i.d.  Her  blood pressures have stabilized within the last 24 hours to 130s-140s over  80s-90s.  The patient has no PIH symptoms, no headache, visual changes, or  epigastric pain.   Her physical exam is within normal limits.  She is still edematous in her  face and in lower extremities; however, she is stable for discharge.   DISCHARGE INSTRUCTIONS:  Per [facility] handout.   DISCHARGE MEDICATIONS:  1.  Motrin 600 mg p.o. q.6h. p.r.n. pain.  2.  Tylox 1-2 p.o. q.3-4h. p.r.n.  3.  Patient is to continue labetalol 200 mg p.o. b.i.d.  A prescription was      provided for her, and she is to schedule an appointment in the office of      [facility] in 1 week for a blood pressure check.   She will call for any signs or  symptoms of PIH, headache, visual changes, or  epigastric pain or any unusual side effects whatsoever.  She will call for  any problems or concerns.      SDM/MEDQ  D:  05/16/2004  T:  05/16/2004  Job:  [id number]

## 2010-12-24 NOTE — Op Note (Signed)
[name], Jean [name] NO.:  [id number]   MEDICAL RECORD NO.:  [id number]          PATIENT TYPE:  AMB   LOCATION:  SDC                           [facility]   PHYSICIAN:  Osborn Coho, M.D.   DATE OF BIRTH:  07-14-1981   DATE OF PROCEDURE:  02/24/2006  DATE OF DISCHARGE:                                 OPERATIVE REPORT   PREOPERATIVE DIAGNOSIS:  1.  Missed abortion.  2.  Cystic hygroma.   POSTOPERATIVE DIAGNOSIS:  1.  Missed abortion.  2.  Cystic hygroma.   PROCEDURE:  Dilation and evacuation.   SURGEON:  Osborn Coho, M.D.   ANESTHESIA:  MAC.   FLUIDS:  1000 mL.   URINE OUTPUT:  Quantity sufficient.   ESTIMATED BLOOD LOSS:  Minimal.   SPECIMEN TO PATHOLOGY:  Products of conception.   COMPLICATIONS:  None.   FINDINGS:  Large amount of POC's.   PROCEDURE:  The patient was taken to the operating room after risks,  benefits, alternatives reviewed with the patient.  The patient verbalized  understanding and consent signed and witnessed.  The patient was given a MAC  per anesthesia and prepped and draped in the normal sterile fashion in the  dorsal lithotomy position.  The cervix was dilated for passage of size 10  suction curette after sounding the uterus to 13 cm.  The suction curette was  introduced and suction curettage was performed until minimal tissue  returned.  Sharp curettage was then performed and a gritty texture noted.  Suction curettage was performed once again to remove any remaining debris.  There was minimal  bleeding from cervical os however the left tenaculum site was noted to be  bleeding and the stitch of 2-0 chromic in a figure-of-eight was placed for  hemostasis.  Good hemostasis was noted.  All instruments were removed.  The  patient tolerated procedure in good condition and was returned to the  recovery room.      Osborn Coho, M.D.  Electronically Signed     AR/MEDQ  D:  02/24/2006  T:  02/24/2006  Job:   [id number]

## 2010-12-24 NOTE — H&P (Signed)
NAMEMarland Kitchen  JANISSA, BERTRAM NO.:  [id number]   MEDICAL RECORD NO.:  [id number]          PATIENT TYPE:  AMB   LOCATION:  SDC                           [facility]   PHYSICIAN:  Osborn Coho, M.D.   DATE OF BIRTH:  [birth year]   DATE OF ADMISSION:  02/24/2006  DATE OF DISCHARGE:                                HISTORY & PHYSICAL   Jean [name] is a 30 year old gravida 2, para 1, 0-0-1 at 13-6/7 weeks who  presents for a scheduled D&E.  The patient had a fetus with a cystic hygroma  noted on her initial ultrasound at 10 weeks.  She was referred to [facility] for a repeat screen.  This was confirmed at [facility] at  approximately 12 weeks.  The patient then presented for a repeat ultrasound  and fetal echo today, and a fetal demise was diagnosed.   Dr. Particia Nearing at [facility] reviewed options with the patient  briefly.  I then spoke with the patient by phone, and she did indicate she  wished to proceed with the D&E.  Patient then was scheduled today for this  dilatation/evacuation secondary to intrauterine fetal demise.   PAST MEDICAL HISTORY:  1.  History has been remarkable for fetus with cystic hygroma noted on 10      week ultrasound, now with an intrauterine fetal demise.  2.  Patient had a seizure in April, 2007 with unknown etiology.  3.  History of preeclampsia.  4.  History of previous cesarean section.  5.  History of questionable last menstrual period.  6.  Questionable Paxil exposure in the first trimester.  She had been on      Paxil and Wellbutrin when she had a seizure in April, 2007, and then she      stopped with her positive UPT and was converted to Zoloft.   PRENATAL LABS:  Blood type is O+.  Rh antibody negative.  VDRL nonreactive.  Rubella titer positive.  Hepatitis B surface antigen negative.  HIV is  nonreactive.  Cystic fibrosis testing is negative.  Hemoglobin on June 22  was 12.7.  Platelets were 212.  Pap was normal in June,  2007.  GC and  Chlamydia cultures were also negative in June, 2007.  EDC of August 25, 2005 was established by 10 week ultrasound secondary to questionable last  menstrual period.   HISTORY OF PRESENT PREGNANCY:  Patient entered care at approximately 10-11  weeks.  There was some question of her gestational age because of  questionable last menstrual period.  She had a seizure in April, 2007.  She  had an MRI.  There was no identified etiology.  She had stopped her  medications of Paxil and Wellbutrin in April secondary to anticipating this  pregnancy.  These were stopped cold [place], and that was noted to be a  possible cause of the seizure.  She did stop the Paxil at that point and was  converted to Zoloft.  There is a question about the degree of questionable  exposure in the first trimester.  At the  patient's new OB visit on June 22,  she had an ultrasound for dating purposes.  I could not hear fetal heart  tones at that time.  The ultrasound showed a 10 week viable pregnancy with  an [facility] of August 25, 2005; however, there was a large cystic hygroma noted  with an abnormal nuchal translucency measurement.  Placenta was posterior.  Dr. Su Hilt was consulted at that time, and the decision was made to refer  the patient to [facility] for first trimester screening and genetic  consultation.  The patient then saw Dr. Pennie Rushing.  In the interim she had  seen Dr. Sherrie George and then saw Dr. Pennie Rushing on July 10th.  There were fetal  heart tones noted at that time.  She had some headaches.  She was on Zoloft  and had also been placed on possibly Lamictal, per the patient's report.  The cystic hygroma presence was confirmed.  A fetal echo was scheduled for  July 19.  The patient was also having some nausea and vomiting.  She was  given Phenergan at that time.  Dr. Sherrie George then called the office on July 19,  informing of the identified fetal demise.  Discussion was held with the  patient  regarding this finding, and the patient wished to proceed with a D&E  as soon as possible.  This is now scheduled on February 24, 2006.   OBSTETRICAL HISTORY:  In 2005, she had a primary low transverse cesarean  section for a female infant, weight 6 pounds, 7 ounces at 40-1/7 weeks.  She  was in labor five hours.  She progressed to 5-6 cm, then had a cord  prolapse.  She also had been diagnosed with preeclampsia and was on  magnesium sulfate at the time.  She also had meconium-stained fluid.  The  infant tolerated labor and the C-section well and has done well since then.   MEDICAL HISTORY:  Patient was on oral contraceptives in the past.  She has  occasional yeast infections.  She reports the usual childhood illnesses.  She had a history of cystitis and pyelonephritis in the past and was  hospitalized.  She had a seizure on April 30th.  She had an MRI.  This was  possibly caused by abrupt discontinuation of Paxil and Wellbutrin.  The  patient has been followed by Dr. Anne Hahn since then.  She was on Topamax for  five days after the seizure.  She has then been managed by [facility] and has been on Zoloft in the past as well and is currently on  Zoloft.   PAST SURGICAL HISTORY:  She had a C-section in 2005.   FAMILY HISTORY:  Her mother has hypertension.  Her maternal grandmother has  varicosities.  Maternal grandmother had adult-onset insulin dependent  diabetes.   GENETIC HISTORY:  Unremarkable except for the fetus this time noted with a  cystic hygroma.   SOCIAL HISTORY:  The patient is single.  The father of the baby is involved  and supportive.  His name is [employer].  The patient is Caucasian.  She does not have any specific religious affiliation.  Patient is  unemployed.  She is caring for her other child as well.   PHYSICAL EXAMINATION:  VITAL SIGNS:  Stable.  Patient is afebrile. HEENT:  Within normal limits.  LUNGS:  Breath sounds are clear.  HEART:   Regular rate and rhythm without murmur.  BREASTS:  Soft and nontender.  ABDOMEN:  Fundal height is approximately 13 weeks size and nontender.  Remarkable for a well-healed low transverse cesarean section incision noted.  PELVIC:  Deferred.  EXTREMITIES:  Deep tendon reflexes are 2+ without clonus.  There is trace  edema noted.   IMPRESSION:  1.  Intrauterine pregnancy at 13-6/7 weeks by ultrasound with a cystic      hygroma and recently identified fetal demise.  2.  Previous cesarean section.  3.  O+ blood type.  4.  History of seizure disorder in April, 2007.   PLAN:  1.  Admit to the [facility], consult with Dr. Osborn Coho, who is the attending physician.  2.  Routine physician preoperative D&E orders.  3.  Risks and benefits of the procedure have been reviewed and will be      reviewed again with the patient by Dr. Su Hilt.  4.  Support will be offered to the patient for the fetal anomaly diagnosis      as well as the fetal loss.      Renaldo Reel Emilee Hero, C.N.M.      Osborn Coho, M.D.  Electronically Signed    VLL/MEDQ  D:  02/23/2006  T:  02/23/2006  Job:  [id number]

## 2010-12-24 NOTE — Op Note (Signed)
NAMESATONYA, LUX              ACCOUNT NO.:  [id number]   MEDICAL RECORD NO.:  [id number]          PATIENT TYPE:  INP   LOCATION:  9372                          [facility]   PHYSICIAN:  Hal Morales, M.D.DATE OF BIRTH:  05-01-1981   DATE OF PROCEDURE:  05/13/2004  DATE OF DISCHARGE:                                 OPERATIVE REPORT   PREOPERATIVE DIAGNOSIS:  Intrauterine pregnancy at term, meconium stained  fluid, preeclampsia, and prolapsed cord.   POSTOPERATIVE DIAGNOSIS:  Intrauterine pregnancy at term, meconium stained  fluid, preeclampsia, and prolapsed cord.   PROCEDURE:  Stat low transverse cesarean section.   SURGEON:  Hal Morales, M.D.   ASSISTANT:  Cam Hai, C.N.M.   ANESTHESIA:  Epidural.   ESTIMATED BLOOD LOSS:  750 mL.   COMPLICATIONS:  None.   FINDINGS:  The patient was delivered of a female infant weighing 6 pounds 10  ounces with Apgars of 8 and 9 at one and five minutes, respectively.  The  uterus, tubes, and ovaries were normal for the gravid state.  The placenta  contained an eccentrically inserted three vessel cord which was markedly  meconium stained.   DESCRIPTION OF PROCEDURE:  The patient was taken urgently from the labor and  delivery suite to the operating room while the surgeon was elevating the  fetal head off the prolapsed cord.  While the surgeon scrubbed, a labor and  delivery nurse continued to hold the head off the prolapsed cord and 0.25 mg  of terbutaline was given subcu.  The patient was placed on the operating  table in the supine position with a left lateral tilt as her epidural was  being dosed for surgical anesthesia.  The abdomen was prepped with multiple  layers of Betadine and draped as a sterile field.  After assurance of  adequate anesthesia, a transverse incision was made in the abdomen and the  abdomen opened in layers.  The peritoneum was entered and the bladder blade  placed.  The uterus was incised  approximately 2 cm above the uterovesical  fold and the incision taken laterally on either side bluntly.  The infant  was delivered from the occipitotransverse position with the aid of  a Kiwi  vacuum extractor and after having the nares and pharynx suctioned with  DeLee, the remainder of the infant was delivered, the cord clamped and cut,  and the infant handed off to the awaiting pediatricians.  The appropriate  cord blood was drawn and the placenta noted to have separated from the  uterus and was removed from the operative field.  The uterine incision was  closed with a running interlocking suture of 0 Vicryl.  An imbricating  suture of 0 Vicryl was placed for adequate hemostasis. Copious irrigation  was carried out and the abdominal peritoneum closed with a running suture of  2-0 Vicryl.  The rectus muscles were reapproximated in the midline with a  figure-of-eight suture of 2-0 Vicryl.  The rectus fascia was closed with a  running suture of 0 Vicryl and then reinforced on either side of the midline  with figure-of-eight sutures of 0 Vicryl.  The subcutaneous tissue was  copiously irrigated and made hemostatic with Bovie cautery. Staples were  applied to the skin incision and a sterile dressing applied.  Because there  was no time to adequately count sponges and instruments prior to beginning  the procedure, an x-ray was performed subsequent to the procedure and prior  to the patient being removed to the recovery room.  The infant went to the  fullterm nursery.      VPH/MEDQ  D:  05/13/2004  T:  05/13/2004  Job:  [id number]

## 2011-04-29 LAB — URINALYSIS, ROUTINE W REFLEX MICROSCOPIC
Glucose, UA: NEGATIVE
Protein, ur: NEGATIVE
Urobilinogen, UA: 1

## 2011-04-29 LAB — URINE MICROSCOPIC-ADD ON

## 2011-04-29 LAB — DIFFERENTIAL
Basophils Relative: 0
Eosinophils Absolute: 0.5
Neutrophils Relative %: 72

## 2011-04-29 LAB — CBC
MCHC: 34.9
MCV: 86.5
Platelets: 216

## 2011-05-17 LAB — CBC
RBC: 4.26
WBC: 5.8

## 2011-05-17 LAB — HCG, SERUM, QUALITATIVE: Preg, Serum: NEGATIVE

## 2011-05-23 LAB — CBC
HCT: 33.4 — ABNORMAL LOW
HCT: 37.2
Hemoglobin: 11.2 — ABNORMAL LOW
Hemoglobin: 12.6
Platelets: 177
Platelets: 201
RBC: 3.65 — ABNORMAL LOW
WBC: 11.5 — ABNORMAL HIGH
WBC: 9.6

## 2011-05-23 LAB — LSPG (L/S RATIO WITH PG)-AMNIO FLUID

## 2011-12-16 ENCOUNTER — Ambulatory Visit: Payer: Self-pay | Admitting: Obstetrics and Gynecology

## 2011-12-30 ENCOUNTER — Ambulatory Visit: Payer: Self-pay | Admitting: Obstetrics and Gynecology

## 2012-02-08 ENCOUNTER — Ambulatory Visit (INDEPENDENT_AMBULATORY_CARE_PROVIDER_SITE_OTHER): Payer: Medicaid Other | Admitting: Obstetrics and Gynecology

## 2012-02-08 DIAGNOSIS — Z331 Pregnant state, incidental: Secondary | ICD-10-CM

## 2012-02-08 LAB — POCT URINALYSIS DIPSTICK
Bilirubin, UA: 1
Glucose, UA: NEGATIVE
Nitrite, UA: NEGATIVE

## 2012-02-08 MED ORDER — PROMETHAZINE HCL 25 MG PO TABS: 25.0000 mg | ORAL_TABLET | ORAL | Status: DC

## 2012-02-08 MED ORDER — PROMETHAZINE HCL 25 MG PO TABS: 25.0000 mg | ORAL_TABLET | Freq: Four times a day (QID) | ORAL | Status: DC | PRN

## 2012-02-08 MED ORDER — PROMETHAZINE HCL 25 MG PO TABS: 25.0000 mg | ORAL_TABLET | ORAL | Status: DC | PRN

## 2012-02-08 NOTE — Progress Notes (Signed)
Pt has a hx of drug addiction, anxiety and depression.  Pt currently taking Suboxone 12 mg and Zoloft 100 mg.  Pt sees Dr. Orvan Falconer for Suboxone and Dr. Jeannetta Nap (PCP) for her Zoloft.  Per pt neither MD knows of current pregnancy and has not decreased dosages of medications.  Pt does not have a Veterinary surgeon.  Per [facility], refer pt to [facility] for counseling and to collaborate w/ pt's PCP to determine how to decrease dosage of Zoloft.  Also to inform Dr. Orvan Falconer of pt's current pregnancy for determination of decreasing Suboxone dosage if able to.  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  Per MK pt to have Parvo titers drawn today along w/ PN labs.  Pt scheduled for an appt @ the [facility] on Monday 02/13/12 @ 0930 with Efraim Kaufmann McKita, pt called to make aware.

## 2012-02-09 LAB — PRENATAL PANEL VII
Antibody Screen: NEGATIVE
Eosinophils Relative: 2 % (ref 0–5)
HCT: 39.7 % (ref 36.0–46.0)
Hemoglobin: 13.6 g/dL (ref 12.0–15.0)
Lymphocytes Relative: 33 % (ref 12–46)
Lymphs Abs: 2 10*3/uL (ref 0.7–4.0)
MCV: 86.7 fL (ref 78.0–100.0)
Platelets: 262 10*3/uL (ref 150–400)
RBC: 4.58 MIL/uL (ref 3.87–5.11)
Rubella: 25.1 IU/mL — ABNORMAL HIGH
WBC: 5.9 10*3/uL (ref 4.0–10.5)

## 2012-02-09 LAB — CULTURE, OB URINE

## 2012-02-11 LAB — PARVOVIRUS B19 ANTIBODY, IGG AND IGM
Parovirus B19 IgG Abs: 2.9 index — ABNORMAL HIGH (ref <0.9)
Parovirus B19 IgM Abs: 0.1 index (ref <0.9)

## 2012-02-19 DIAGNOSIS — F1920 Other psychoactive substance dependence, uncomplicated: Secondary | ICD-10-CM | POA: Insufficient documentation

## 2012-02-19 DIAGNOSIS — K819 Cholecystitis, unspecified: Secondary | ICD-10-CM | POA: Insufficient documentation

## 2012-02-19 DIAGNOSIS — Z98891 History of uterine scar from previous surgery: Secondary | ICD-10-CM | POA: Insufficient documentation

## 2012-02-19 DIAGNOSIS — O3440 Maternal care for other abnormalities of cervix, unspecified trimester: Secondary | ICD-10-CM | POA: Insufficient documentation

## 2012-02-19 DIAGNOSIS — F419 Anxiety disorder, unspecified: Secondary | ICD-10-CM | POA: Insufficient documentation

## 2012-02-19 DIAGNOSIS — F329 Major depressive disorder, single episode, unspecified: Secondary | ICD-10-CM | POA: Insufficient documentation

## 2012-02-20 ENCOUNTER — Other Ambulatory Visit: Payer: Self-pay | Admitting: Obstetrics and Gynecology

## 2012-02-20 MED ORDER — PROMETHAZINE HCL 25 MG PO TABS: 25.0000 mg | ORAL_TABLET | Freq: Four times a day (QID) | ORAL | Status: DC | PRN

## 2012-02-22 ENCOUNTER — Other Ambulatory Visit: Payer: Self-pay | Admitting: Obstetrics and Gynecology

## 2012-02-22 ENCOUNTER — Ambulatory Visit (INDEPENDENT_AMBULATORY_CARE_PROVIDER_SITE_OTHER): Payer: Medicaid Other | Admitting: Obstetrics and Gynecology

## 2012-02-22 ENCOUNTER — Encounter: Payer: Self-pay | Admitting: Obstetrics and Gynecology

## 2012-02-22 VITALS — BP 100/68 | Ht 69.0 in | Wt 149.0 lb

## 2012-02-22 DIAGNOSIS — F411 Generalized anxiety disorder: Secondary | ICD-10-CM

## 2012-02-22 DIAGNOSIS — Z98891 History of uterine scar from previous surgery: Secondary | ICD-10-CM

## 2012-02-22 DIAGNOSIS — K819 Cholecystitis, unspecified: Secondary | ICD-10-CM

## 2012-02-22 DIAGNOSIS — F419 Anxiety disorder, unspecified: Secondary | ICD-10-CM

## 2012-02-22 DIAGNOSIS — F329 Major depressive disorder, single episode, unspecified: Secondary | ICD-10-CM

## 2012-02-22 DIAGNOSIS — Z331 Pregnant state, incidental: Secondary | ICD-10-CM

## 2012-02-22 DIAGNOSIS — Z124 Encounter for screening for malignant neoplasm of cervix: Secondary | ICD-10-CM

## 2012-02-22 DIAGNOSIS — F1920 Other psychoactive substance dependence, uncomplicated: Secondary | ICD-10-CM

## 2012-02-22 DIAGNOSIS — Z369 Encounter for antenatal screening, unspecified: Secondary | ICD-10-CM

## 2012-02-22 DIAGNOSIS — O3440 Maternal care for other abnormalities of cervix, unspecified trimester: Secondary | ICD-10-CM

## 2012-02-22 DIAGNOSIS — N898 Other specified noninflammatory disorders of vagina: Secondary | ICD-10-CM

## 2012-02-22 LAB — POCT WET PREP (WET MOUNT): pH: 4.5

## 2012-02-22 MED ORDER — METRONIDAZOLE 500 MG PO TABS: 500.0000 mg | ORAL_TABLET | Freq: Two times a day (BID) | ORAL | Status: AC

## 2012-02-22 MED ORDER — TERCONAZOLE 0.4 % VA CREA: 1.0000 | TOPICAL_CREAM | Freq: Every day | VAGINAL | Status: AC

## 2012-02-22 MED ORDER — FUSION PLUS PO CAPS: 1.0000 | ORAL_CAPSULE | ORAL | Status: DC

## 2012-02-22 NOTE — Progress Notes (Signed)
Pt wants genetic screenings Pap due today Pt doesn't intend to breastfeed

## 2012-02-22 NOTE — Progress Notes (Signed)
Patient ID: Jean [name], female   DOB: Mar 06, 1981, 31 y.o.   MRN: [id number]  Jean [name] is here for a new obstetrical visit.  Certain of LMP used for dating [redacted]w[redacted]d Hx significant for: C/S x 2 Leep Substance abuse Anxiety depresion @IPILAPH @ OB History    Grav Para Term Preterm Abortions TAB SAB Ect Mult Living   4 2 1 1 1  1   2     2005 C/S for nonreassuring FHTS 2008 C/S 36 weeks nonreassuring antenatal testing no preterm labor Past Medical History  Diagnosis Date  . Chronic cholecystitis 11/2010  . FHx: cancer   . FHx: hypertension   . FHx: diabetes mellitus   . Yeast infection     NOT FREQ  . CIN I (cervical intraepithelial neoplasia I)   . CIN II (cervical intraepithelial neoplasia II)   . H/O varicella   . H/O chlamydia infection 2005    WAS TREATED  . Hx: UTI (urinary tract infection)     NOT FREQ  . UTI (lower urinary tract infection)   . Anxiety     TAKES ZOLOFT  . Pregnancy induced hypertension 2005    WAS GIVEN BP MEDS  WHILE IN HOSPITAL AND PP  . Depression     TAKES ZOLOFT  . Abnormal Pap smear 2008    COLPO DONE; WAS NORMAL;LAST PAP 06/2010 WAS NORMAL  . Seizure     ONLY ONLY ONE;D/T DRUG OVERDOSE  . Drug abuse     WAS ON METHADONE IN PAST;SUBOXONE  CURRENTLY   Past Surgical History  Procedure Date  . Dilation and curettage of uterus   . Laparoscopic cholecystectomy w/ cholangiography 11/09/2010  . Cesarean section 2005 & 2008  . Leep 06/29/2007  . Wisdom tooth extraction     ALL 4 EXTRACTED  cholecystectomy Family History: family history includes Cancer in her mother; Depression in her father and mother; Diabetes in her maternal grandmother; Hyperlipidemia in her mother; Hypertension in her mother; and Other in her maternal grandmother. Social History:  reports that she has been smoking Cigarettes.  She has been smoking about .5 packs per day. She has never used smokeless tobacco. She reports that she does not drink alcohol or use illicit  drugs.    Blood pressure 100/68, height 5\' 9"  (1.753 m), weight 149 lb (67.586 kg), last menstrual period 12/13/2011.  Physical exam: Calm, no distress Alert, appropriate appearance for age. No acute distress HEENT Grossly normal Thyroid no masses, nodes or enlargement  lungs clear bilaterally, AP RRR, breasts bilaterally no masses, dimpling, drainage, abd soft, gravid, nt, bowel sounds active no edema to lower extremities EGBUS and perineum wnl, normal hair distribution Vagina pink moist Cervix LTC no cerical motion tenderness Scant yellow discharge appears adequate Fundal height FHTS  Prenatal labs: ABO, Rh: O/POS/-- (07/03 1124) Antibody: NEG (07/03 1124) Rubella:   RPR: NON REAC (07/03 1124)  HBsAg: NEGATIVE (07/03 1124)  HIV: NON REACTIVE (07/03 1124)  GBS:     Assessment/Plan: [redacted]w[redacted]d Gc/chl to lab Wet prep: neg trich, hyphae, positive clue Pap: need results recently done at health dept Genetic screen: 1st trimester screen  Rx flagyl, terazol discussed. Discussed need for appt at [facility] f/o. Collaboration with Dr. Normand Sloop. Jean [name] 02/22/2012, 1:29 PM Lavera Guise, CNM

## 2012-02-22 NOTE — Telephone Encounter (Signed)
TRIAGE/EPIC °

## 2012-02-22 NOTE — Progress Notes (Signed)
Pap done today  

## 2012-02-24 LAB — PAP IG, CT-NG, RFX HPV ASCU

## 2012-03-14 ENCOUNTER — Ambulatory Visit (INDEPENDENT_AMBULATORY_CARE_PROVIDER_SITE_OTHER): Payer: Medicaid Other

## 2012-03-14 DIAGNOSIS — Z369 Encounter for antenatal screening, unspecified: Secondary | ICD-10-CM

## 2012-03-14 DIAGNOSIS — Z36 Encounter for antenatal screening of mother: Secondary | ICD-10-CM

## 2012-03-14 LAB — US OB COMP LESS 14 WKS

## 2012-03-23 ENCOUNTER — Encounter: Payer: Self-pay | Admitting: Obstetrics and Gynecology

## 2012-03-23 ENCOUNTER — Ambulatory Visit (INDEPENDENT_AMBULATORY_CARE_PROVIDER_SITE_OTHER): Payer: Medicaid Other | Admitting: Obstetrics and Gynecology

## 2012-03-23 VITALS — BP 100/60 | Wt 154.0 lb

## 2012-03-23 DIAGNOSIS — Z3480 Encounter for supervision of other normal pregnancy, unspecified trimester: Secondary | ICD-10-CM

## 2012-03-23 NOTE — Progress Notes (Signed)
Pt states no concerns today.   

## 2012-03-24 NOTE — Progress Notes (Signed)
First trimester screen WNL AFP@[facility]  Pt with h/o c/s times two she plans repeat cesarean Pt on subaxone and followed by Dr Orvan Falconer

## 2012-03-26 ENCOUNTER — Telehealth: Payer: Self-pay

## 2012-03-26 NOTE — Telephone Encounter (Signed)
Spoke with pt rgd getting record pt will have Dr. Lovina Reach fax all her records

## 2012-03-26 NOTE — Telephone Encounter (Signed)
Message copied by Rolla Plate on Mon Mar 26, 2012  2:51 PM ------      Message from: Jaymes Graff      Created: Sat Mar 24, 2012  2:41 PM       Please obtain records on this patient from Dr Orvan Falconer.  I believe he supplies her subaxone

## 2012-04-11 ENCOUNTER — Other Ambulatory Visit: Payer: Self-pay | Admitting: Obstetrics and Gynecology

## 2012-04-11 ENCOUNTER — Telehealth: Payer: Self-pay | Admitting: Obstetrics and Gynecology

## 2012-04-11 MED ORDER — PROMETHAZINE HCL 25 MG PO TABS: 25.0000 mg | ORAL_TABLET | Freq: Four times a day (QID) | ORAL | Status: DC | PRN

## 2012-04-11 NOTE — Telephone Encounter (Addendum)
TC from pt.   States has had cough x 3 days. Is feeling better today. Had greenish mucus 04/10/12. Today had blood tinged mucus.   T-99.5. States has been vomiting food and fluids x the past few days. Having nausea today.  Needs RF of Phenergan.  Suggested Delsym or Robituson. Pt will consult with pharmacist to be sure OK to take with Suboxone. Will consult with provider for RF Phenergan.  To call with T>100.4 or no improvement.Pt verbalizes comprehension.

## 2012-04-11 NOTE — Telephone Encounter (Signed)
TC from pt.  Informed Phenergan RX called in.  Ok to take cough med as discussed. Pt states is able to retain food and fluids. Also took Zoloft and retained. To call with any concerns. Pt verbalizes comprehension.

## 2012-04-11 NOTE — Telephone Encounter (Signed)
Per Dr SR, RF for Phenergan OK.  Called to [place] at [facility]. Per pharmacist, may take Robitussin or Delsym with Suboxone. TC to pt. Lm to return call.

## 2012-04-11 NOTE — Telephone Encounter (Deleted)
Pt  States is feeling better today but still experiencing nausea. Will consult with pharmacist to be sure OK to take with Suboxone.with cough med.  Will consult with provider for RF Phenergan.  To call if T.100.4 or no improvement. Pt verbalizes comprehension.

## 2012-04-19 ENCOUNTER — Other Ambulatory Visit: Payer: Self-pay

## 2012-04-19 DIAGNOSIS — Z3689 Encounter for other specified antenatal screening: Secondary | ICD-10-CM

## 2012-04-20 ENCOUNTER — Ambulatory Visit (INDEPENDENT_AMBULATORY_CARE_PROVIDER_SITE_OTHER): Payer: Medicaid Other

## 2012-04-20 ENCOUNTER — Encounter: Payer: Self-pay | Admitting: Obstetrics and Gynecology

## 2012-04-20 ENCOUNTER — Ambulatory Visit (INDEPENDENT_AMBULATORY_CARE_PROVIDER_SITE_OTHER): Payer: Medicaid Other | Admitting: Obstetrics and Gynecology

## 2012-04-20 VITALS — BP 90/52 | Wt 159.0 lb

## 2012-04-20 DIAGNOSIS — Z3689 Encounter for other specified antenatal screening: Secondary | ICD-10-CM

## 2012-04-20 DIAGNOSIS — Z1379 Encounter for other screening for genetic and chromosomal anomalies: Secondary | ICD-10-CM

## 2012-04-20 DIAGNOSIS — Z3490 Encounter for supervision of normal pregnancy, unspecified, unspecified trimester: Secondary | ICD-10-CM

## 2012-04-20 DIAGNOSIS — Z331 Pregnant state, incidental: Secondary | ICD-10-CM

## 2012-04-20 LAB — US OB COMP + 14 WK

## 2012-04-20 NOTE — Progress Notes (Signed)
[redacted]w[redacted]d Anatomy USS today. Nausea ++ Had been taking Phenergan but making her sleepy Will prescribe Zofran 4mg s ODT Q 6hrly PRN No change vaginal secretions. ROB x 4 weeks USS Anatomy USS result: Vx Presentation, posterior placenta. No previa. Normal Fluid. AP = 4.9cms                    Measurements c/w GA EDD and 1st USS                    No fetal abnormality seem                    Cx closed. Normal Adnexa

## 2012-04-20 NOTE — Progress Notes (Signed)
WNL anatomy u/s today

## 2012-05-07 ENCOUNTER — Telehealth: Payer: Self-pay

## 2012-05-09 NOTE — Telephone Encounter (Signed)
No r/c since 05-07-12.  ld

## 2012-05-18 ENCOUNTER — Ambulatory Visit (INDEPENDENT_AMBULATORY_CARE_PROVIDER_SITE_OTHER): Payer: Medicaid Other | Admitting: Obstetrics and Gynecology

## 2012-05-18 ENCOUNTER — Encounter: Payer: Self-pay | Admitting: Obstetrics and Gynecology

## 2012-05-18 VITALS — BP 90/66 | Wt 165.0 lb

## 2012-05-18 DIAGNOSIS — Z331 Pregnant state, incidental: Secondary | ICD-10-CM

## 2012-05-18 MED ORDER — ONDANSETRON 8 MG PO TBDP: 8.0000 mg | ORAL_TABLET | Freq: Three times a day (TID) | ORAL | Status: DC | PRN

## 2012-05-18 NOTE — Progress Notes (Signed)
Patient ID: Jean [name], female   DOB: [birth year], 31 y.o.   MRN: [id number] [redacted]w[redacted]d AFP today Requests zofran rx did not go through to pharmacy last visit discussed risks constipation, measures. Lavera Guise, CNM

## 2012-05-18 NOTE — Patient Instructions (Signed)
Constipation, Adult Constipation is when a person has fewer than 3 bowel movements a week; has difficulty having a bowel movement; or has stools that are dry, hard, or larger than normal. As people grow older, constipation is more common. If you try to fix constipation with medicines that make you have a bowel movement (laxatives), the problem may get worse. Long-term laxative use may cause the muscles of the colon to become weak. A low-fiber diet, not taking in enough fluids, and taking certain medicines may make constipation worse. CAUSES   Certain medicines, such as antidepressants, pain medicine, iron supplements, antacids, and water pills.   Certain diseases, such as diabetes, irritable bowel syndrome (IBS), thyroid disease, or depression.   Not drinking enough water.   Not eating enough fiber-rich foods.   Stress or travel.  Lack of physical activity or exercise.  Not going to the restroom when there is the urge to have a bowel movement.  Ignoring the urge to have a bowel movement.  Using laxatives too much. SYMPTOMS   Having fewer than 3 bowel movements a week.   Straining to have a bowel movement.   Having hard, dry, or larger than normal stools.   Feeling full or bloated.   Pain in the lower abdomen.  Not feeling relief after having a bowel movement. DIAGNOSIS  Your caregiver will take a medical history and perform a physical exam. Further testing may be done for severe constipation. Some tests may include:   A barium enema X-ray to examine your rectum, colon, and sometimes, your small intestine.  A sigmoidoscopy to examine your lower colon.  A colonoscopy to examine your entire colon. TREATMENT  Treatment will depend on the severity of your constipation and what is causing it. Some dietary treatments include drinking more fluids and eating more fiber-rich foods. Lifestyle treatments may include regular exercise. If these diet and lifestyle recommendations  do not help, your caregiver may recommend taking over-the-counter laxative medicines to help you have bowel movements. Prescription medicines may be prescribed if over-the-counter medicines do not work.  HOME CARE INSTRUCTIONS   Increase dietary fiber in your diet, such as fruits, vegetables, whole grains, and beans. Limit high-fat and processed sugars in your diet, such as [place] fries, hamburgers, cookies, candies, and soda.   A fiber supplement may be added to your diet if you cannot get enough fiber from foods.   Drink enough fluids to keep your urine clear or pale yellow.   Exercise regularly or as directed by your caregiver.   Go to the restroom when you have the urge to go. Do not hold it.  Only take medicines as directed by your caregiver. Do not take other medicines for constipation without talking to your caregiver first. SEEK IMMEDIATE MEDICAL CARE IF:   You have bright red blood in your stool.   Your constipation lasts for more than 4 days or gets worse.   You have abdominal or rectal pain.   You have thin, pencil-like stools.  You have unexplained weight loss. MAKE SURE YOU:   Understand these instructions.  Will watch your condition.  Will get help right away if you are not doing well or get worse. miralax 1-5 day to prevent constipation

## 2012-05-18 NOTE — Progress Notes (Signed)
[redacted]w[redacted]d No concerns per pt

## 2012-05-21 LAB — ALPHA FETOPROTEIN, MATERNAL
AFP: 89.1 IU/mL
MoM for AFP: 1.4

## 2012-06-14 ENCOUNTER — Ambulatory Visit (INDEPENDENT_AMBULATORY_CARE_PROVIDER_SITE_OTHER): Payer: Medicaid Other | Admitting: Obstetrics and Gynecology

## 2012-06-14 VITALS — BP 90/62 | Wt 163.0 lb

## 2012-06-14 DIAGNOSIS — Z9889 Other specified postprocedural states: Secondary | ICD-10-CM

## 2012-06-14 DIAGNOSIS — Z98891 History of uterine scar from previous surgery: Secondary | ICD-10-CM

## 2012-06-14 NOTE — Progress Notes (Signed)
[redacted]w[redacted]d Pt complains of cold symptoms, coughing Temp 98.2

## 2012-06-14 NOTE — Progress Notes (Signed)
Doing well.  Planning C/S with BTL with [facility] (has done 2 previous C/S). Mother recently dx with Stage 4 colon ca, going through chemo to shrink tumor, then surgery. FOB "took off", no longer in picture.   Support to patient for issues. Continues in care with Dr. Orvan Falconer for drug dependency, on subaxone--stable, and doing well. Can't stay for glucola today--will schedule at [facility]. [facility] with [facility] to discuss C/S and BTL--needs consent.

## 2012-06-26 ENCOUNTER — Ambulatory Visit (INDEPENDENT_AMBULATORY_CARE_PROVIDER_SITE_OTHER): Payer: Medicaid Other | Admitting: Obstetrics and Gynecology

## 2012-06-26 ENCOUNTER — Other Ambulatory Visit: Payer: Medicaid Other

## 2012-06-26 VITALS — BP 100/58 | Wt 164.0 lb

## 2012-06-26 DIAGNOSIS — Z331 Pregnant state, incidental: Secondary | ICD-10-CM

## 2012-06-26 DIAGNOSIS — F329 Major depressive disorder, single episode, unspecified: Secondary | ICD-10-CM

## 2012-06-26 DIAGNOSIS — Z98891 History of uterine scar from previous surgery: Secondary | ICD-10-CM

## 2012-06-26 DIAGNOSIS — Z9889 Other specified postprocedural states: Secondary | ICD-10-CM

## 2012-06-26 DIAGNOSIS — F1920 Other psychoactive substance dependence, uncomplicated: Secondary | ICD-10-CM

## 2012-06-26 MED ORDER — LUVENA VAGINAL MOISTURIZER VA GEL: 1.0000 | VAGINAL | Status: DC

## 2012-06-26 NOTE — Progress Notes (Signed)
MFM appt sched 07/03/12 @ 11:30a

## 2012-06-26 NOTE — Progress Notes (Signed)
[redacted]w[redacted]d  C/O: dull pain lower left abdomen area that comes and goes, now resolved 1 GTT today Draw @ 4:01pm  Has MFM appt Will sign BTL consent today

## 2012-06-27 ENCOUNTER — Encounter (HOSPITAL_COMMUNITY): Payer: Self-pay | Admitting: Obstetrics and Gynecology

## 2012-06-27 LAB — HEMOGLOBIN: Hemoglobin: 10.2 g/dL — ABNORMAL LOW (ref 12.0–15.0)

## 2012-06-27 LAB — RPR

## 2012-07-02 ENCOUNTER — Other Ambulatory Visit: Payer: Self-pay | Admitting: Obstetrics and Gynecology

## 2012-07-02 ENCOUNTER — Telehealth: Payer: Self-pay | Admitting: Obstetrics and Gynecology

## 2012-07-02 NOTE — Telephone Encounter (Signed)
Repeat C/S Scheduled for 09/12/11 @ 9:30 with [facility]. -Adrianne Pridgen

## 2012-07-03 ENCOUNTER — Telehealth: Payer: Self-pay | Admitting: Obstetrics and Gynecology

## 2012-07-03 ENCOUNTER — Encounter (HOSPITAL_COMMUNITY): Payer: Self-pay

## 2012-07-03 ENCOUNTER — Ambulatory Visit (HOSPITAL_COMMUNITY)
Admission: RE | Admit: 2012-07-03 | Discharge: 2012-07-03 | Disposition: A | Discharge status: Home / Self Care | Payer: Medicaid Other | Source: Ambulatory Visit | Attending: Obstetrics and Gynecology | Admitting: Obstetrics and Gynecology

## 2012-07-03 DIAGNOSIS — C189 Malignant neoplasm of colon, unspecified: Secondary | ICD-10-CM | POA: Insufficient documentation

## 2012-07-03 DIAGNOSIS — O99891 Other specified diseases and conditions complicating pregnancy: Secondary | ICD-10-CM | POA: Insufficient documentation

## 2012-07-03 DIAGNOSIS — O34219 Maternal care for unspecified type scar from previous cesarean delivery: Secondary | ICD-10-CM | POA: Insufficient documentation

## 2012-07-03 NOTE — Progress Notes (Signed)
MATERNAL FETAL MEDICINE CONSULT  Patient Name: ADELINA COLLARD Medical Record Number:  [id number] Date of Birth: 1981/05/29 Requesting Physician Name:  Hal Morales, MD Date of Service: 07/03/2012  Chief Complaint Suboxone and Zoloft use in pregnancy  History of Present Illness SHELVA HETZER was seen today for prenatal diagnosis secondary to Suboxone and Zoloft use at the request of Hal Morales, MD.  The patient is a 31 y.o. [id number],YQ [redacted]w[redacted]d with an EDD of 09/18/2012, by Last Menstrual Period dating method.  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  She most recently has been on Suboxone maintenance for the past 3 years.  For 2 year prior to that she was maintained on methadone.  Her current dose of Suboxone is 12 mg per day.  She was previously maintained on a daily dose of 16 mg, but this was weaned to 12 mg a few months prior to conception.  She is free of withdrawal symptoms on her current dose of 12 mg daily.  She has a long history of major depression and anxiety for which she has taken Zoloft for the past 8 years.  She was on 100 mg daily prior to pregnancy, and her dose was recently reduced to 50 mg.  She reports good control of her depression and anxiety on this reduced dose.  Her only other complicating factor is her tobacco use.  She smokes 1/2 pack per day.  Review of Systems Pertinent items are noted in HPI.  Patient History OB History    Grav Para Term Preterm Abortions TAB SAB Ect Mult Living   4 2 1 1 1  1   2      # Outc Date GA Lbr Len/2nd Wgt Sex Del Anes PTL Lv   1 TRM 2005 [redacted]w[redacted]d 10:00 6lb7oz(2.92kg) F LTCS EPI  Yes   Comments: CORD WRAPPED AROUND, WAS DROPPING HR   2 SAB 7/07    U    No   Comments: D & E   3 PRE 7/08 [redacted]w[redacted]d  6lb7oz(2.92kg) F LTCS Spinal  Yes   Comments: NO COMPLICATIONS   4 CUR               Past Medical History  Diagnosis Date  . Chronic cholecystitis 11/2010  . FHx: cancer   . FHx:  hypertension   . FHx: diabetes mellitus   . Yeast infection     NOT FREQ  . CIN I (cervical intraepithelial neoplasia I)   . CIN II (cervical intraepithelial neoplasia II)   . H/O varicella   . H/O chlamydia infection 2005    WAS TREATED  . Hx: UTI (urinary tract infection)     NOT FREQ  . UTI (lower urinary tract infection)   . Anxiety     TAKES ZOLOFT  . Pregnancy induced hypertension 2005    WAS GIVEN BP MEDS  WHILE IN HOSPITAL AND PP  . Depression     TAKES ZOLOFT  . Abnormal Pap smear 2008    COLPO DONE; WAS NORMAL;LAST PAP 06/2010 WAS NORMAL  . Seizure     ONLY ONLY ONE;D/T DRUG OVERDOSE  . Drug abuse     WAS ON METHADONE IN PAST;SUBOXONE  CURRENTLY    Past Surgical History  Procedure Date  . Dilation and curettage of uterus   . Laparoscopic cholecystectomy w/ cholangiography 11/09/2010  . Cesarean section 2005 & 2008  . Leep 06/29/2007  . Wisdom tooth extraction  ALL 4 EXTRACTED    History   Social History  . Marital Status: Single    Spouse Name: N/A    Number of Children: 2  . Years of Education: 12   Occupational History  .     Social History Main Topics  . Smoking status: Current Every Day Smoker -- 0.5 packs/day    Types: Cigarettes  . Smokeless tobacco: Never Used     Comment: WOULD LIKE TO QUIT  . Alcohol Use: No  . Drug Use: No  . Sexually Active: Yes -- Female partner(s)    Birth Control/ Protection: Pill, Injection, None     Comment: SE[place];DEPO PROVERA IN PAST   Other Topics Concern  . None   Social History Narrative  . None    Family History  Problem Relation Age of Onset  . Hypertension Mother   . Diabetes Maternal Grandmother     IDDM  . Cancer Mother     STAGE IV;RECENT DX  . Other Maternal Grandmother     VARICOSE VEINS  . Hyperlipidemia Mother   . Depression Mother     ON MEDS  . Depression Father     ON MEDS   In addition, the patient has no family history of mental retardation, birth defects, or genetic  diseases.  She is unaware of the father of the baby's family history.  He is no longer part of Ms. Cantrall life and is not accessible to obtain a family history.  Physical Examination Filed Vitals:   07/03/12 1119  BP: 116/62  Pulse: 120   General appearance - alert, well appearing, and in no distress  Assessment and Recommendations 1.  Suboxone use in pregnancy.  Ms. Lemler is currently free of withdrawal symptoms on her current dose of Suboxone.  In general, the risks associated with opiate withdrawal in pregnancy are thought to outweigh the risks of ongoing fetal opiate exposure.  Thus, I recommend maintaining Ms. Fye on her current dose of Suboxone so long as she remains symptoms free.  As pregnancy progresses and blood volume expands the relative Suboxone dose can decrease leading to the appearance of withdrawal symptoms on a stable dose of opiate replacement.  As a result it is often necessary to increase doses of opiate replacement during pregnancy.  Replacement dose should be titrated to the minimum dose required to control withdrawal symptoms.  These adjustments will need to be done by the provider of her Suboxone.  Opiate use has been associated with fetal growth restriction, although much of this is likely attributable to other high risk behaviors that often accompany opiate abuse.  However, other than tobacco use Ms. Renton has no other complicating factors that could contribute to problems in fetal growth or in-utero well-being.  I recommend monthly ultrasounds to assess fetal growth.  If fetal growth restrictions develop I recommend instituting twice weekly fetal testing, and consideration of delivery at 37 weeks, or earlier if clinically indicated.  If no further complications of pregnancy evolve I recommend proceeding with elective repeat LTCS (which the patient is requesting) at 39 weeks of gestation.  The time of greater concern is after delivery, when the neonate will likely experience  opiate withdrawal.  The extent and duration of neonatal withdrawal symptoms can be difficult to predict.  Ms. Mckiver would benefit from a consultation with Neonatology to discuss specifics of neonatal withdrawal and detoxification.  We will contact the NICU at [facility] to facilitate this consultation.     2.  Zoloft use in pregnancy.  Zoloft is also associated with a neonatal abstinence syndrome; however, it is typically less severe than that seen with opiate use during pregnancy.  Thus, I think this is a minor concern in Ms. Wilmes's case.  She was recently decreased to 50 mg a day from 100 mg a day and reports stable mood and anxiety level.  As 50 mg is a low dose I do not recommend weaning further, as discontinuation of an anti-depressant is associated with a significant risk of recurrent depression and anxiety. 3.  Tobacco use.  Ms. Runkle continues to smoke 1/2 packs per day.  Given her accompanying Suboxone use and depression/anxiety, she is not a good candidate for attempts at smoking cessation in pregnancy as doing so could worsen either of those issues.  This is particular the case now as Ms. Werman does not have a strong desire to quit at this time.  Should her attitude toward smoking cessation change attempts to quit could be made with nicotine replacement as a cessation aid.  I spent 30 minutes with Ms. Sterry today of which 50% was face-to-face counseling.   Rema Fendt, MD

## 2012-07-10 ENCOUNTER — Encounter: Payer: Self-pay | Admitting: Obstetrics and Gynecology

## 2012-07-10 ENCOUNTER — Ambulatory Visit (INDEPENDENT_AMBULATORY_CARE_PROVIDER_SITE_OTHER): Payer: Medicaid Other | Admitting: Obstetrics and Gynecology

## 2012-07-10 VITALS — BP 98/52 | Wt 166.0 lb

## 2012-07-10 DIAGNOSIS — Z98891 History of uterine scar from previous surgery: Secondary | ICD-10-CM

## 2012-07-10 DIAGNOSIS — F419 Anxiety disorder, unspecified: Secondary | ICD-10-CM

## 2012-07-10 DIAGNOSIS — F411 Generalized anxiety disorder: Secondary | ICD-10-CM

## 2012-07-10 DIAGNOSIS — F329 Major depressive disorder, single episode, unspecified: Secondary | ICD-10-CM

## 2012-07-10 DIAGNOSIS — O3440 Maternal care for other abnormalities of cervix, unspecified trimester: Secondary | ICD-10-CM

## 2012-07-10 DIAGNOSIS — Z9889 Other specified postprocedural states: Secondary | ICD-10-CM

## 2012-07-10 NOTE — Progress Notes (Signed)
Pt stated no issues today.  Reviewed MFM consult.  Rec: Every 4 wk u/s      Antenatal testing only if IUGR occurs      Rpt c/s and btl at 39 wks unless IUGR occurs, in which case c/s, btl at 37 wks      MFM is arranging NICU consult to address neonatal withdrawal postpartum Pt signed tubal papers last visit

## 2012-07-24 ENCOUNTER — Ambulatory Visit (INDEPENDENT_AMBULATORY_CARE_PROVIDER_SITE_OTHER): Payer: Medicaid Other | Admitting: Obstetrics and Gynecology

## 2012-07-24 ENCOUNTER — Ambulatory Visit (INDEPENDENT_AMBULATORY_CARE_PROVIDER_SITE_OTHER): Payer: Medicaid Other

## 2012-07-24 ENCOUNTER — Other Ambulatory Visit: Payer: Self-pay

## 2012-07-24 VITALS — BP 110/62 | Wt 168.0 lb

## 2012-07-24 DIAGNOSIS — O358XX0 Maternal care for other (suspected) fetal abnormality and damage, not applicable or unspecified: Secondary | ICD-10-CM

## 2012-07-24 DIAGNOSIS — O3440 Maternal care for other abnormalities of cervix, unspecified trimester: Secondary | ICD-10-CM

## 2012-07-24 DIAGNOSIS — Z98891 History of uterine scar from previous surgery: Secondary | ICD-10-CM

## 2012-07-24 DIAGNOSIS — Z9889 Other specified postprocedural states: Secondary | ICD-10-CM

## 2012-07-24 DIAGNOSIS — IMO0001 Reserved for inherently not codable concepts without codable children: Secondary | ICD-10-CM

## 2012-07-24 MED ORDER — ONDANSETRON 8 MG PO TBDP: 8.0000 mg | ORAL_TABLET | Freq: Three times a day (TID) | ORAL | Status: DC | PRN

## 2012-07-24 NOTE — Addendum Note (Signed)
Addended by: Osborn Coho on: 07/24/2012 05:17 PM   Modules accepted: Orders

## 2012-07-24 NOTE — Progress Notes (Signed)
[redacted]w[redacted]d No complaints Reviewed u/s findings EFW 4-14 84th%, inc fluid AFI 27.0cm, vtx, post placenta, incidental finding of umbilical vein varix - refer to MFM for recs FKCs RTO 1wk for BPP and f/u varix

## 2012-07-25 ENCOUNTER — Other Ambulatory Visit: Payer: Self-pay | Admitting: Obstetrics and Gynecology

## 2012-07-25 ENCOUNTER — Telehealth: Payer: Self-pay | Admitting: Obstetrics and Gynecology

## 2012-07-25 DIAGNOSIS — O358XX0 Maternal care for other (suspected) fetal abnormality and damage, not applicable or unspecified: Secondary | ICD-10-CM

## 2012-07-25 NOTE — Telephone Encounter (Signed)
Tc to pt to inform, per msg below, scheduled for a consult @ MFM on tomorrow 07/26/12 @ 1415.  Lm on vm to call back.

## 2012-07-25 NOTE — Telephone Encounter (Signed)
Message copied by Delon Sacramento on Wed Jul 25, 2012 10:59 AM ------      Message from: Osborn Coho      Created: Tue Jul 24, 2012  4:44 PM       Please make sure pt was referred to MFM to consult on umbilical vein varix and make recs.  Thank you

## 2012-07-26 ENCOUNTER — Ambulatory Visit (HOSPITAL_COMMUNITY)
Admission: RE | Admit: 2012-07-26 | Discharge: 2012-07-26 | Disposition: A | Discharge status: Home / Self Care | Payer: Medicaid Other | Source: Ambulatory Visit | Attending: Obstetrics and Gynecology | Admitting: Obstetrics and Gynecology

## 2012-07-26 ENCOUNTER — Other Ambulatory Visit: Payer: Self-pay | Admitting: Obstetrics and Gynecology

## 2012-07-26 ENCOUNTER — Other Ambulatory Visit: Payer: Self-pay

## 2012-07-26 DIAGNOSIS — IMO0002 Reserved for concepts with insufficient information to code with codable children: Secondary | ICD-10-CM

## 2012-07-26 DIAGNOSIS — F1920 Other psychoactive substance dependence, uncomplicated: Secondary | ICD-10-CM | POA: Insufficient documentation

## 2012-07-26 DIAGNOSIS — O99320 Drug use complicating pregnancy, unspecified trimester: Secondary | ICD-10-CM | POA: Insufficient documentation

## 2012-07-26 DIAGNOSIS — O09299 Supervision of pregnancy with other poor reproductive or obstetric history, unspecified trimester: Secondary | ICD-10-CM | POA: Insufficient documentation

## 2012-07-26 DIAGNOSIS — O3440 Maternal care for other abnormalities of cervix, unspecified trimester: Secondary | ICD-10-CM | POA: Insufficient documentation

## 2012-07-26 DIAGNOSIS — O358XX0 Maternal care for other (suspected) fetal abnormality and damage, not applicable or unspecified: Secondary | ICD-10-CM

## 2012-07-26 DIAGNOSIS — O34219 Maternal care for unspecified type scar from previous cesarean delivery: Secondary | ICD-10-CM | POA: Insufficient documentation

## 2012-07-26 DIAGNOSIS — F1120 Opioid dependence, uncomplicated: Secondary | ICD-10-CM | POA: Insufficient documentation

## 2012-07-26 DIAGNOSIS — Z8751 Personal history of pre-term labor: Secondary | ICD-10-CM | POA: Insufficient documentation

## 2012-07-26 DIAGNOSIS — O99330 Smoking (tobacco) complicating pregnancy, unspecified trimester: Secondary | ICD-10-CM | POA: Insufficient documentation

## 2012-07-26 NOTE — Consult Note (Addendum)
MFM consult  31 yr old [id number] at [redacted]w[redacted]d with narcotic dependence on suboxone and history of preeclampsia referred by Dr. Su Hilt for consult and fetal ultrasound secondary to finding of umbilical vein varix on outside ultrasound.  Ultrasound today shows: estimated fetal weight is in the 64th%. Posterior placenta without evidence of previa. Normal amniotic fluid index although increased for gestational age. 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 There is a midline cystic structure in the abdomen that is consistent with a prominent gall bladder. The views of the palate, ductal arch, hands, and ankles are limited. The remainder of the limited anatomy survey is normal.  Past OB hx: - full term delivery C section- complicated by preeclampsia at term - miscarriage- had cystic hygroma; no genetics done - preterm delivery at 36 weeks for nonreassuring fetal testing; repeat C section  I counseled the patient as follows:  1. Appropriate fetal growth. 2. Cystic structure in the abdomen: - likely prominent gall bladder - will continue to follow on ultrasound 3. History of preeclampsia: - discussed increased risk of recurrence - recommend close surveillance for the development of signs/symptoms of preeclampsia 4. Suboxone use: - previously counseled - recommend fetal growth every 4 weeks - recommend NICU consult to discuss risks of neonatal abstinence syndrome 5. 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 I discussed that umbilical vein varix can be associated with other anomalies and when associated with other anomalies may have an increased risk of aneuploidy. However, the fetus has a normal albeit limited anatomic survey. Patient had a normal first trimester screen which showed risk of trisomy 21 of <1:10,000; risk of trisomy 42 of 1:4,466. Discussed limitations of screening tests in detecting fetal aneuploidy. Discussed option of amniocentesis and benefits and risks  and option of cell free fetal DNA screening and its limitations in detecting fetal aneuploidy. After counseling patient opted to proceed with [place] screen which was drawn today. Discussed that the literature is mixed regarding the risks associated with umbilical vein varix. Most of the literature suggests and elevated risk of stillbirth even in cases of isolated umbilical vein varix. The rates have been quoted from 10-30%.  Discussed our goal is to balance the risk of stillbirth with risk of prematurity. Options for management include: antenatal testing starting now (BPP was 8/8 today) until delivery- I recommend that the patient have antenatal testing with either twice weekly nonstress tests and weekly amniotic fluid index with weekly evaluation of the varix or weekly biophysical profiles with weekly evaluation of AFI and varix. Options for delivery timing are: delivery at 37 weeks with an amniocentesis for fetal lung maturity- discussed this would lessen the risk of delivering an infant that would require significant intervention; the risk is early delivery and small risk of prematurity. If the fetal lung profile is immature would need to decide if would wait until [redacted] weeks gestation for delivery or 1 week from amniocentesis. There would of course still be there risk of fetal demise until delivery but would lessen the risks of prematurity.  Another option is delivery at 37 weeks without an amniocentesis. This would eliminate risk of fetal demise from 37 weeks but would incur the small risk of immature lung profile and need for respiratory support.  Alternatively delivery could be offered at 39 weeks. This would likely greatly reduce the risks of immaturity but the risk of stillbirth would continue until delivery.   After counseling patient would like to proceed with delivery at 37 weeks; which  I feel is a reasonable option. 6. Increased amniotic fluid index: - patient had normal glucola - will  reevaluate in 1 week  I spent 30 minutes in face to face consultation with the patient in addition to time spent on the ultrasound.   Eulis Foster, MD

## 2012-07-26 NOTE — Progress Notes (Signed)
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  Findings: 1. Single intrauterine pregnancy. 2. Estimated fetal weight is in the 64th%. 3. Posterior placenta without evidence of previa. 4. Normal amniotic fluid index although increased for gestational age. 5. 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 6. There is a midline cystic structure in the abdomen that is consistent with a prominent gall bladder. 7. The views of the palate, ductal arch, hands, and ankles are limited. 8. The remainder of the limited anatomy survey is normal.  Recommendations: 1. Appropriate fetal growth. 2. Cystic structure in the abdomen: - likely prominent gall bladder - will continue to follow on ultrasound 3. History of preeclampsia: - discussed increased risk of recurrence - recommend close surveillance for the development of signs/symptoms of preeclampsia 4. Suboxone use: - previously counseled - recommend fetal growth every 4 weeks - recommend NICU consult to discuss risks of neonatal abstinence syndrome 5. Umbilical vein varix: - see consult letter - recommend antenatal testing- had reactive NST today; can be done in primary OB office 6. Increased amniotic fluid index: - patient had normal glucola - will reevaluate in 1 week  Eulis Foster, MD

## 2012-07-30 LAB — US OB FOLLOW UP

## 2012-08-03 ENCOUNTER — Encounter: Payer: Self-pay | Admitting: Obstetrics and Gynecology

## 2012-08-03 ENCOUNTER — Ambulatory Visit (INDEPENDENT_AMBULATORY_CARE_PROVIDER_SITE_OTHER): Payer: Medicaid Other | Admitting: Obstetrics and Gynecology

## 2012-08-03 VITALS — BP 90/60 | Wt 168.0 lb

## 2012-08-03 DIAGNOSIS — O359XX0 Maternal care for (suspected) fetal abnormality and damage, unspecified, not applicable or unspecified: Secondary | ICD-10-CM | POA: Insufficient documentation

## 2012-08-03 DIAGNOSIS — O358XX0 Maternal care for other (suspected) fetal abnormality and damage, not applicable or unspecified: Secondary | ICD-10-CM

## 2012-08-03 DIAGNOSIS — IMO0001 Reserved for inherently not codable concepts without codable children: Secondary | ICD-10-CM

## 2012-08-03 DIAGNOSIS — Q899 Congenital malformation, unspecified: Secondary | ICD-10-CM

## 2012-08-03 NOTE — Patient Instructions (Signed)
Fetal Movement Counts Patient Name: __________________________________________________ Patient Due Date: ____________________ Kick counts is highly recommended in high risk pregnancies, but it is a good idea for every pregnant woman to do. Start counting fetal movements at 28 weeks of the pregnancy. Fetal movements increase after eating a full meal or eating or drinking something sweet (the blood sugar is higher). It is also important to drink plenty of fluids (well hydrated) before doing the count. Lie on your left side because it helps with the circulation or you can sit in a comfortable chair with your arms over your belly (abdomen) with no distractions around you. DOING THE COUNT  Try to do the count the same time of day each time you do it.  Mark the day and time, then see how long it takes for you to feel 10 movements (kicks, flutters, swishes, rolls). You should have at least 10 movements within 2 hours. You will most likely feel 10 movements in much less than 2 hours. If you do not, wait an hour and count again. After a couple of days you will see a pattern.  What you are looking for is a change in the pattern or not enough counts in 2 hours. Is it taking longer in time to reach 10 movements? SEEK MEDICAL CARE IF:  You feel less than 10 counts in 2 hours. Tried twice.  No movement in one hour.  The pattern is changing or taking longer each day to reach 10 counts in 2 hours.  You feel the baby is not moving as it usually does. Date: ____________ Movements: ____________ Start time: ____________ Finish time: ____________  Date: ____________ Movements: ____________ Start time: ____________ Finish time: ____________ Date: ____________ Movements: ____________ Start time: ____________ Finish time: ____________ Date: ____________ Movements: ____________ Start time: ____________ Finish time: ____________ Date: ____________ Movements: ____________ Start time: ____________ Finish time:  ____________ Date: ____________ Movements: ____________ Start time: ____________ Finish time: ____________ Date: ____________ Movements: ____________ Start time: ____________ Finish time: ____________ Date: ____________ Movements: ____________ Start time: ____________ Finish time: ____________  Date: ____________ Movements: ____________ Start time: ____________ Finish time: ____________ Date: ____________ Movements: ____________ Start time: ____________ Finish time: ____________ Date: ____________ Movements: ____________ Start time: ____________ Finish time: ____________ Date: ____________ Movements: ____________ Start time: ____________ Finish time: ____________ Date: ____________ Movements: ____________ Start time: ____________ Finish time: ____________ Date: ____________ Movements: ____________ Start time: ____________ Finish time: ____________ Date: ____________ Movements: ____________ Start time: ____________ Finish time: ____________  Date: ____________ Movements: ____________ Start time: ____________ Finish time: ____________ Date: ____________ Movements: ____________ Start time: ____________ Finish time: ____________ Date: ____________ Movements: ____________ Start time: ____________ Finish time: ____________ Date: ____________ Movements: ____________ Start time: ____________ Finish time: ____________ Date: ____________ Movements: ____________ Start time: ____________ Finish time: ____________ Date: ____________ Movements: ____________ Start time: ____________ Finish time: ____________ Date: ____________ Movements: ____________ Start time: ____________ Finish time: ____________  Date: ____________ Movements: ____________ Start time: ____________ Finish time: ____________ Date: ____________ Movements: ____________ Start time: ____________ Finish time: ____________ Date: ____________ Movements: ____________ Start time: ____________ Finish time: ____________ Date: ____________ Movements:  ____________ Start time: ____________ Finish time: ____________ Date: ____________ Movements: ____________ Start time: ____________ Finish time: ____________ Date: ____________ Movements: ____________ Start time: ____________ Finish time: ____________ Date: ____________ Movements: ____________ Start time: ____________ Finish time: ____________  Date: ____________ Movements: ____________ Start time: ____________ Finish time: ____________ Date: ____________ Movements: ____________ Start time: ____________ Finish time: ____________ Date: ____________ Movements: ____________ Start time: ____________ Finish time: ____________ Date: ____________ Movements:   ____________ Start time: ____________ Finish time: ____________ Date: ____________ Movements: ____________ Start time: ____________ Finish time: ____________ Date: ____________ Movements: ____________ Start time: ____________ Finish time: ____________ Date: ____________ Movements: ____________ Start time: ____________ Finish time: ____________  Date: ____________ Movements: ____________ Start time: ____________ Finish time: ____________ Date: ____________ Movements: ____________ Start time: ____________ Finish time: ____________ Date: ____________ Movements: ____________ Start time: ____________ Finish time: ____________ Date: ____________ Movements: ____________ Start time: ____________ Finish time: ____________ Date: ____________ Movements: ____________ Start time: ____________ Finish time: ____________ Date: ____________ Movements: ____________ Start time: ____________ Finish time: ____________ Date: ____________ Movements: ____________ Start time: ____________ Finish time: ____________  Date: ____________ Movements: ____________ Start time: ____________ Finish time: ____________ Date: ____________ Movements: ____________ Start time: ____________ Finish time: ____________ Date: ____________ Movements: ____________ Start time: ____________ Finish  time: ____________ Date: ____________ Movements: ____________ Start time: ____________ Finish time: ____________ Date: ____________ Movements: ____________ Start time: ____________ Finish time: ____________ Date: ____________ Movements: ____________ Start time: ____________ Finish time: ____________ Date: ____________ Movements: ____________ Start time: ____________ Finish time: ____________  Date: ____________ Movements: ____________ Start time: ____________ Finish time: ____________ Date: ____________ Movements: ____________ Start time: ____________ Finish time: ____________ Date: ____________ Movements: ____________ Start time: ____________ Finish time: ____________ Date: ____________ Movements: ____________ Start time: ____________ Finish time: ____________ Date: ____________ Movements: ____________ Start time: ____________ Finish time: ____________ Date: ____________ Movements: ____________ Start time: ____________ Finish time: ____________ Document Released: 08/24/2006 Document Revised: 10/17/2011 Document Reviewed: 02/24/2009 [employer] Patient Information 2013 [employer].  

## 2012-08-03 NOTE — Progress Notes (Signed)
[redacted]w[redacted]d NST today Plan c/s at 37 weeks Growth Korea  q 2 weeks

## 2012-08-03 NOTE — Progress Notes (Signed)
Pt w/o complaint today.  

## 2012-08-07 ENCOUNTER — Telehealth: Payer: Self-pay

## 2012-08-07 ENCOUNTER — Telehealth (HOSPITAL_COMMUNITY): Payer: Self-pay | Admitting: MS"

## 2012-08-07 ENCOUNTER — Other Ambulatory Visit: Payer: Self-pay | Admitting: Obstetrics and Gynecology

## 2012-08-07 ENCOUNTER — Encounter: Payer: Self-pay | Admitting: Obstetrics and Gynecology

## 2012-08-07 ENCOUNTER — Ambulatory Visit (INDEPENDENT_AMBULATORY_CARE_PROVIDER_SITE_OTHER): Payer: Medicaid Other | Admitting: Obstetrics and Gynecology

## 2012-08-07 ENCOUNTER — Ambulatory Visit (INDEPENDENT_AMBULATORY_CARE_PROVIDER_SITE_OTHER): Payer: Medicaid Other

## 2012-08-07 VITALS — BP 112/72 | Wt 170.0 lb

## 2012-08-07 DIAGNOSIS — Q899 Congenital malformation, unspecified: Secondary | ICD-10-CM

## 2012-08-07 DIAGNOSIS — IMO0001 Reserved for inherently not codable concepts without codable children: Secondary | ICD-10-CM

## 2012-08-07 DIAGNOSIS — O358XX0 Maternal care for other (suspected) fetal abnormality and damage, not applicable or unspecified: Secondary | ICD-10-CM

## 2012-08-07 LAB — US OB LIMITED

## 2012-08-07 NOTE — Progress Notes (Signed)
[redacted]w[redacted]d  

## 2012-08-07 NOTE — Telephone Encounter (Signed)
Message copied by Rolla Plate on Tue Aug 07, 2012 10:23 AM ------      Message from: Jaymes Graff      Created: Fri Aug 03, 2012 11:48 AM       Please do NICU consult      Pt on suboxone      Infant with umbical vein varix      Please notify MFM and ask when they want her delivered.

## 2012-08-07 NOTE — Telephone Encounter (Signed)
Called [facility] regarding patient's cell free fetal DNA testing result, given that patient is currently in office for OB visit.  Testing was offered because of previous ultrasound finding of umbilical vein varix. We reviewed that these are within normal limits, showing a less than 1 in 10,000 risk for trisomies 21, 18 and 13. This testing identifies > 99% of pregnancies with trisomy 21, >98% of pregnancies with trisomy 67, and >80% with trisomy 13; the false positive rate is <0.1% for all conditions.   Quinn Plowman, MS Certified Genetic Counselor 08/07/2012 4:29 PM

## 2012-08-07 NOTE — Telephone Encounter (Signed)
Left message for patient to return call regarding "good news."  Jean [name] 08/07/2012 4:28 PM

## 2012-08-07 NOTE — Progress Notes (Signed)
Plans repeat C/S, with recommendation from MFM for delivery at 37 weeks, with amnio for [facility] (if patient declines amnio, needs to be counseled regarding risk of premature lungs). Needs BPP weekly--will do next week with MD visit to begin to make plans for delivery. GBS NV with cultures. [place] today, with normal growth and fluid upper limits of normal, subjectively increased.  BPP 8/8, vtx presentation. Varix present without thrombosis.

## 2012-08-07 NOTE — Progress Notes (Signed)
Pt stated no issues today. [facility] called today regarding harmony test done they stated  harmony risk was low less 1-10,000.  Ultrasound shows:  SIUP  S=D     [place] EDD: 09/18/2012            AFI: 21.94 85th % tile           Placenta localization: posterior           Fetal presentation: vertex                    Anatomy survey is umbilical vein varix= 1.2 cm ( no thrombosis is seen in color dopplers) suggest to f/u in 1 week.

## 2012-08-07 NOTE — Telephone Encounter (Signed)
Referral to NICU for consult pt on suboxone and infant with umbilical vein varix spoke with Dr.Carlos she will call pt to set up consult appt

## 2012-08-15 ENCOUNTER — Ambulatory Visit (INDEPENDENT_AMBULATORY_CARE_PROVIDER_SITE_OTHER): Payer: Medicaid Other

## 2012-08-15 ENCOUNTER — Ambulatory Visit (INDEPENDENT_AMBULATORY_CARE_PROVIDER_SITE_OTHER): Payer: Medicaid Other | Admitting: Obstetrics and Gynecology

## 2012-08-15 ENCOUNTER — Telehealth: Payer: Self-pay | Admitting: Obstetrics and Gynecology

## 2012-08-15 ENCOUNTER — Encounter: Payer: Self-pay | Admitting: Obstetrics and Gynecology

## 2012-08-15 VITALS — BP 122/66 | Wt 172.0 lb

## 2012-08-15 DIAGNOSIS — Q899 Congenital malformation, unspecified: Secondary | ICD-10-CM

## 2012-08-15 DIAGNOSIS — O358XX0 Maternal care for other (suspected) fetal abnormality and damage, not applicable or unspecified: Secondary | ICD-10-CM

## 2012-08-15 DIAGNOSIS — Z98891 History of uterine scar from previous surgery: Secondary | ICD-10-CM

## 2012-08-15 DIAGNOSIS — IMO0001 Reserved for inherently not codable concepts without codable children: Secondary | ICD-10-CM

## 2012-08-15 DIAGNOSIS — Z9889 Other specified postprocedural states: Secondary | ICD-10-CM

## 2012-08-15 NOTE — Progress Notes (Addendum)
[redacted]w[redacted]d BPP 8/8, nl fluid and stable varix at 1.2cm After consultation with MFM, they rec scheduling the repeat c/s at 37wks secondary to inc risk of still birth secondary to umbilical vein varix.  They have discussed this with the pt and they have decided against an amnio prior to c/s and pt understands the possible risks of prematurity. Will sched at 37wks - pt would like me to still do the c/s - will plan for 1/22 - also planning BTL and says she signed tubal papers FKCs and labor precautions BPP, AFI anf f/u varix in 1wk

## 2012-08-15 NOTE — Addendum Note (Signed)
Addended by: Marla Roe A on: 08/15/2012 03:21 PM   Modules accepted: Orders

## 2012-08-15 NOTE — Telephone Encounter (Signed)
Repeat C/S Rescheduled to 08/29/12 @ 11:30 with AR. -Adrianne Pridgen

## 2012-08-16 ENCOUNTER — Encounter (HOSPITAL_COMMUNITY): Payer: Self-pay | Admitting: Pharmacist

## 2012-08-22 ENCOUNTER — Other Ambulatory Visit: Payer: Self-pay

## 2012-08-22 DIAGNOSIS — Q899 Congenital malformation, unspecified: Secondary | ICD-10-CM

## 2012-08-23 ENCOUNTER — Encounter: Payer: Self-pay | Admitting: Obstetrics and Gynecology

## 2012-08-23 ENCOUNTER — Other Ambulatory Visit: Payer: Self-pay | Admitting: Obstetrics and Gynecology

## 2012-08-23 ENCOUNTER — Ambulatory Visit: Payer: Medicaid Other

## 2012-08-23 ENCOUNTER — Ambulatory Visit: Payer: Medicaid Other | Admitting: Obstetrics and Gynecology

## 2012-08-23 VITALS — BP 102/60 | Wt 171.0 lb

## 2012-08-23 DIAGNOSIS — Z331 Pregnant state, incidental: Secondary | ICD-10-CM

## 2012-08-23 DIAGNOSIS — Q899 Congenital malformation, unspecified: Secondary | ICD-10-CM

## 2012-08-23 NOTE — Progress Notes (Signed)
[redacted]w[redacted]d GBS

## 2012-08-23 NOTE — Addendum Note (Signed)
Addended by: Darien Ramus on: 08/23/2012 12:48 PM   Modules accepted: Orders

## 2012-08-23 NOTE — Progress Notes (Signed)
Ultrasound shows:  SIUP  S=D     [place] EDD: 09/18/12           AFI: 21.26           Cervical length: not measured           Placenta localization: posterior           Fetal presentation: vertex Comments: singleton pregnancy, AFI normal 80th% Umbilical vein varix = 1.2 cm No thrombosis is seen BPP = 8/8 in 7 minutes

## 2012-08-23 NOTE — Progress Notes (Signed)
[redacted]w[redacted]d GFM CS scheduled with AR 09/03/12 No complaints GBS done

## 2012-08-24 LAB — US OB LIMITED

## 2012-08-27 ENCOUNTER — Encounter (HOSPITAL_COMMUNITY): Payer: Self-pay

## 2012-08-27 ENCOUNTER — Encounter (HOSPITAL_COMMUNITY)
Admission: RE | Admit: 2012-08-27 | Discharge: 2012-08-27 | Disposition: A | Discharge status: Home / Self Care | Payer: Medicaid Other | Source: Ambulatory Visit | Attending: Obstetrics and Gynecology | Admitting: Obstetrics and Gynecology

## 2012-08-27 HISTORY — DX: Drug use complicating pregnancy, unspecified trimester: O99.320

## 2012-08-27 HISTORY — DX: Opioid dependence, uncomplicated: F11.20

## 2012-08-27 LAB — TYPE AND SCREEN: Antibody Screen: NEGATIVE

## 2012-08-27 LAB — CBC
HCT: 32 % — ABNORMAL LOW (ref 36.0–46.0)
Hemoglobin: 10.8 g/dL — ABNORMAL LOW (ref 12.0–15.0)
RDW: 13.3 % (ref 11.5–15.5)
WBC: 15.6 10*3/uL — ABNORMAL HIGH (ref 4.0–10.5)

## 2012-08-27 NOTE — Pre-Procedure Instructions (Addendum)
Pt with history of drug abuse-Oxycodone-methadone till approx 2008 then on suboxone-currently maintained on suboxone. Dr Sheral Apley notified-in to speak with pt-collaborated with Dr Orvan Falconer in [facility] managing Suboxone and Dr Lance Morin for post op pain management. Dr Sheral Apley spoke with pt answering questions concerning pain management-PCA and avoiding intrathecal narcotics. Dr Su Hilt to manage PCA post-operatively.  Received call from Dr Belva Bertin formal recommendation from Dr A Roberts/ Dr Orvan Falconer (pt's Pain Management MD from [facility]) for Suboxone and post operative pain management. Called Dr A Roberts-requested RN to call Dr Orvan Falconer for recommendations.  Dr Orvan Falconer called-requested professional recommendation for d/c Suboxone-will fax in am-stated to call pt and have her stop suboxone after today-will not take doses tomorrow or dos--ok for c/s on Wednesday. Dr Sheral Apley notified. Requested old anesthesia records from 2008 c/s

## 2012-08-27 NOTE — Anesthesia Preprocedure Evaluation (Addendum)
Anesthesia Evaluation  Patient identified by MRN, date of birth, ID band Patient awake    Reviewed: Allergy & Precautions, H&P , NPO status , Patient's Chart, lab work & pertinent test results  Airway Mallampati: II      Dental No notable dental hx.    Pulmonary neg pulmonary ROS,  breath sounds clear to auscultation  Pulmonary exam normal       Cardiovascular Exercise Tolerance: Good negative cardio ROS  Rhythm:regular Rate:Normal     Neuro/Psych Seizures -,  negative neurological ROS  negative psych ROS   GI/Hepatic negative GI ROS, Neg liver ROS,   Endo/Other  negative endocrine ROS  Renal/GU negative Renal ROS  negative genitourinary   Musculoskeletal   Abdominal Normal abdominal exam  (+)   Peds  Hematology negative hematology ROS (+)   Anesthesia Other Findings CIN II (cervical intraepithelial neoplasia II)     H/O varicella        H/O chlamydia infection 2005 WAS TREATED Hx: UTI (urinary tract infection)   NOT FREQ    UTI (lower urinary tract infection)     Anxiety   TAKES ZOLOFT    Pregnancy induced hypertension 2005 WAS GIVEN BP MEDS  WHILE IN HOSPITAL AND PP Depression   TAKES ZOLOFT    Abnormal Pap smear 2008 COLPO DONE; WAS NORMAL;LAST PAP 06/2010 WAS NORMAL Drug abuse   WAS ON METHADONE IN PAST;SUBOXONE  CURRENTLY    Seizure   ONLY ONLY ONE;D/T medication reaction Suboxone maintenance treatment complicating pregnancy, antepartum    Chronic cholecystitis 11/2010    Reproductive/Obstetrics (+) Pregnancy                           Anesthesia Physical Anesthesia Plan  ASA: III  Anesthesia Plan: Epidural   Post-op Pain Management:    Induction:   Airway Management Planned:   Additional Equipment:   Intra-op Plan:   Post-operative Plan:   Informed Consent: I have reviewed the patients History and Physical, chart, labs and discussed the procedure including the risks,  benefits and alternatives for the proposed anesthesia with the patient or authorized representative who has indicated his/her understanding and acceptance.     Plan Discussed with: Anesthesiologist, CRNA and Surgeon  Anesthesia Plan Comments:       I spoke with chronic pain management MD from [place] Orvan Falconer) and discussed intra-operative pain management as well as post operative pain management.  I also discussed with patient the likelihood of post operative pain that is difficult to control.  In light of her past history and her current therapy, he thought that using a post op PCA and avoiding intrathecal narcotics would be best.  He thought she may require up to 1.5x the standard dosing regiment.  I spoke with Dr. Su Hilt about this and she understands that she will handle the post-operative pain control after the patient leaves the PACU.  I am concerned about the patient's potential withdrawal peaking at 3-5 days and the possibility that she could have unpredictable responses to narcotics. I have conveyed my concern with Dr. Su Hilt that timing the discontinuation of the suboxone appropriately with the input of the Pain MD is very important.  I encouraged her to speak with him ASAP to make a good plan about stopping the suboxone at the appropriate time.  I answered the patients questions. Anesthesia Quick Evaluation

## 2012-08-27 NOTE — Patient Instructions (Addendum)
   Your procedure is scheduled on: Wednesday January 22nd  Enter through the [employer] of [facility] at: 10am Pick up the phone at the desk and dial [phone] and inform us of your arrival.  Please call this number if you have any problems the morning of surgery: [phone]  Remember: Do not eat food after midnight on Tuesday You may have water until 7:30am Wednesday then nothing You may take your zoloft and zofran morning of surgery with sips of water. Take 4mg  strip of suboxone morning of surgery  Do not wear jewelry, make-up, or FINGER nail polish No metal in your hair or on your body. Do not wear lotions, powders, perfumes. You may wear deodorant.  Please use your CHG wash as directed prior to surgery.  Do not shave anywhere for at least 12 hours prior to first CHG shower.  Do not bring valuables to the hospital.   Leave suitcase in the car. After Surgery it may be brought to your room. For patients being admitted to the hospital, checkout time is 11:00am the day of discharge.

## 2012-08-29 ENCOUNTER — Encounter (HOSPITAL_COMMUNITY): Payer: Self-pay | Admitting: Anesthesiology

## 2012-08-29 ENCOUNTER — Inpatient Hospital Stay (HOSPITAL_COMMUNITY)
Admission: RE | Admit: 2012-08-29 | Discharge: 2012-08-31 | DRG: 766 | Disposition: A | Discharge status: Home / Self Care | Payer: Medicaid Other | Source: Ambulatory Visit | Attending: Obstetrics and Gynecology | Admitting: Obstetrics and Gynecology

## 2012-08-29 ENCOUNTER — Encounter (HOSPITAL_COMMUNITY): Payer: Self-pay | Admitting: Obstetrics and Gynecology

## 2012-08-29 ENCOUNTER — Encounter (HOSPITAL_COMMUNITY): Payer: Self-pay | Admitting: *Deleted

## 2012-08-29 ENCOUNTER — Encounter (HOSPITAL_COMMUNITY)
Admission: RE | Disposition: A | Discharge status: Home / Self Care | Payer: Self-pay | Source: Ambulatory Visit | Attending: Obstetrics and Gynecology

## 2012-08-29 ENCOUNTER — Inpatient Hospital Stay (HOSPITAL_COMMUNITY): Payer: Medicaid Other | Admitting: Anesthesiology

## 2012-08-29 DIAGNOSIS — D649 Anemia, unspecified: Secondary | ICD-10-CM | POA: Diagnosis not present

## 2012-08-29 DIAGNOSIS — F1910 Other psychoactive substance abuse, uncomplicated: Secondary | ICD-10-CM | POA: Diagnosis present

## 2012-08-29 DIAGNOSIS — O99344 Other mental disorders complicating childbirth: Secondary | ICD-10-CM | POA: Diagnosis present

## 2012-08-29 DIAGNOSIS — O9903 Anemia complicating the puerperium: Secondary | ICD-10-CM | POA: Diagnosis not present

## 2012-08-29 DIAGNOSIS — O358XX0 Maternal care for other (suspected) fetal abnormality and damage, not applicable or unspecified: Principal | ICD-10-CM | POA: Diagnosis present

## 2012-08-29 DIAGNOSIS — Z302 Encounter for sterilization: Secondary | ICD-10-CM

## 2012-08-29 LAB — RPR: RPR Ser Ql: NONREACTIVE

## 2012-08-29 SURGERY — Surgical Case
Anesthesia: Epidural | Site: Abdomen | Laterality: Bilateral | Wound class: Clean Contaminated

## 2012-08-29 MED ORDER — DIPHENHYDRAMINE HCL 50 MG/ML IJ SOLN: 12.5000 mg | Freq: Four times a day (QID) | INTRAMUSCULAR | Status: DC | PRN

## 2012-08-29 MED ORDER — PHENYLEPHRINE HCL 10 MG/ML IJ SOLN
INTRAMUSCULAR | Status: DC | PRN
  Administered 2012-08-29 (×3): 80 ug via INTRAVENOUS

## 2012-08-29 MED ORDER — SERTRALINE HCL 50 MG PO TABS
50.0000 mg | ORAL_TABLET | Freq: Every day | ORAL | Status: DC
  Administered 2012-08-30 – 2012-08-31 (×2): 50 mg via ORAL
  Filled 2012-08-29 (×2): qty 1

## 2012-08-29 MED ORDER — SODIUM CHLORIDE 0.9 % IJ SOLN: 9.0000 mL | INTRAMUSCULAR | Status: DC | PRN

## 2012-08-29 MED ORDER — DIPHENHYDRAMINE HCL 50 MG/ML IJ SOLN: 25.0000 mg | INTRAMUSCULAR | Status: DC | PRN

## 2012-08-29 MED ORDER — NALOXONE HCL 0.4 MG/ML IJ SOLN: 0.4000 mg | INTRAMUSCULAR | Status: DC | PRN

## 2012-08-29 MED ORDER — FENTANYL CITRATE 0.05 MG/ML IJ SOLN
INTRAMUSCULAR | Status: AC
  Filled 2012-08-29: qty 2

## 2012-08-29 MED ORDER — ONDANSETRON HCL 4 MG PO TABS: 4.0000 mg | ORAL_TABLET | ORAL | Status: DC | PRN

## 2012-08-29 MED ORDER — SODIUM CHLORIDE 0.9 % IV SOLN
INTRAVENOUS | Status: DC
  Administered 2012-08-29 (×3): via EPIDURAL
  Filled 2012-08-29: qty 16

## 2012-08-29 MED ORDER — OXYTOCIN 10 UNIT/ML IJ SOLN
40.0000 [IU] | INTRAVENOUS | Status: DC | PRN
  Administered 2012-08-29: 40 [IU] via INTRAVENOUS

## 2012-08-29 MED ORDER — 0.9 % SODIUM CHLORIDE (POUR BTL) OPTIME
TOPICAL | Status: DC | PRN
  Administered 2012-08-29: 1000 mL

## 2012-08-29 MED ORDER — ONDANSETRON HCL 4 MG/2ML IJ SOLN
INTRAMUSCULAR | Status: AC
  Filled 2012-08-29: qty 2

## 2012-08-29 MED ORDER — PNEUMOCOCCAL VAC POLYVALENT 25 MCG/0.5ML IJ INJ
0.5000 mL | INJECTION | INTRAMUSCULAR | Status: AC
  Filled 2012-08-29: qty 0.5

## 2012-08-29 MED ORDER — METOCLOPRAMIDE HCL 5 MG/ML IJ SOLN
10.0000 mg | Freq: Three times a day (TID) | INTRAMUSCULAR | Status: DC | PRN
  Administered 2012-08-29: 10 mg via INTRAVENOUS

## 2012-08-29 MED ORDER — OXYTOCIN 10 UNIT/ML IJ SOLN
INTRAMUSCULAR | Status: AC
  Filled 2012-08-29: qty 4

## 2012-08-29 MED ORDER — PHENYLEPHRINE 40 MCG/ML (10ML) SYRINGE FOR IV PUSH (FOR BLOOD PRESSURE SUPPORT)
PREFILLED_SYRINGE | INTRAVENOUS | Status: AC
  Filled 2012-08-29: qty 10

## 2012-08-29 MED ORDER — SIMETHICONE 80 MG PO CHEW
80.0000 mg | CHEWABLE_TABLET | Freq: Three times a day (TID) | ORAL | Status: DC
  Administered 2012-08-29 – 2012-08-31 (×6): 80 mg via ORAL

## 2012-08-29 MED ORDER — NALBUPHINE SYRINGE 5 MG/0.5 ML
5.0000 mg | INJECTION | INTRAMUSCULAR | Status: DC | PRN
  Filled 2012-08-29: qty 1

## 2012-08-29 MED ORDER — SENNOSIDES-DOCUSATE SODIUM 8.6-50 MG PO TABS
2.0000 | ORAL_TABLET | Freq: Every day | ORAL | Status: DC
  Administered 2012-08-29 – 2012-08-30 (×2): 2 via ORAL

## 2012-08-29 MED ORDER — ONDANSETRON HCL 4 MG/2ML IJ SOLN: 4.0000 mg | Freq: Three times a day (TID) | INTRAMUSCULAR | Status: DC | PRN

## 2012-08-29 MED ORDER — SODIUM CHLORIDE 0.9 % IV SOLN: INTRAVENOUS | Status: DC

## 2012-08-29 MED ORDER — LACTATED RINGERS IV SOLN
INTRAVENOUS | Status: DC
  Administered 2012-08-29 – 2012-08-30 (×2): via INTRAVENOUS

## 2012-08-29 MED ORDER — IBUPROFEN 600 MG PO TABS
600.0000 mg | ORAL_TABLET | Freq: Four times a day (QID) | ORAL | Status: DC
  Administered 2012-08-29 – 2012-08-31 (×6): 600 mg via ORAL
  Filled 2012-08-29 (×5): qty 1

## 2012-08-29 MED ORDER — OXYTOCIN 40 UNITS IN LACTATED RINGERS INFUSION - SIMPLE MED: 62.5000 mL/h | INTRAVENOUS | Status: AC

## 2012-08-29 MED ORDER — SCOPOLAMINE 1 MG/3DAYS TD PT72
MEDICATED_PATCH | TRANSDERMAL | Status: AC
  Administered 2012-08-29: 1.5 mg via TRANSDERMAL
  Filled 2012-08-29: qty 1

## 2012-08-29 MED ORDER — SODIUM BICARBONATE 8.4 % IV SOLN
INTRAVENOUS | Status: DC | PRN
  Administered 2012-08-29: 3 mL via EPIDURAL

## 2012-08-29 MED ORDER — SODIUM CHLORIDE 0.9 % IJ SOLN: 3.0000 mL | INTRAMUSCULAR | Status: DC | PRN

## 2012-08-29 MED ORDER — ONDANSETRON HCL 4 MG/2ML IJ SOLN: 4.0000 mg | Freq: Four times a day (QID) | INTRAMUSCULAR | Status: DC | PRN

## 2012-08-29 MED ORDER — LIDOCAINE-EPINEPHRINE (PF) 2 %-1:200000 IJ SOLN
INTRAMUSCULAR | Status: AC
  Filled 2012-08-29: qty 20

## 2012-08-29 MED ORDER — DIPHENHYDRAMINE HCL 25 MG PO CAPS
25.0000 mg | ORAL_CAPSULE | Freq: Four times a day (QID) | ORAL | Status: DC | PRN
  Filled 2012-08-29: qty 1

## 2012-08-29 MED ORDER — KETOROLAC TROMETHAMINE 30 MG/ML IJ SOLN
INTRAMUSCULAR | Status: AC
  Administered 2012-08-29: 30 mg via INTRAVENOUS
  Filled 2012-08-29: qty 1

## 2012-08-29 MED ORDER — TETANUS-DIPHTH-ACELL PERTUSSIS 5-2.5-18.5 LF-MCG/0.5 IM SUSP
0.5000 mL | Freq: Once | INTRAMUSCULAR | Status: DC
  Filled 2012-08-29: qty 0.5

## 2012-08-29 MED ORDER — MEPERIDINE HCL 25 MG/ML IJ SOLN: 6.2500 mg | INTRAMUSCULAR | Status: DC | PRN

## 2012-08-29 MED ORDER — MORPHINE SULFATE 0.5 MG/ML IJ SOLN
INTRAMUSCULAR | Status: AC
  Filled 2012-08-29: qty 10

## 2012-08-29 MED ORDER — METOCLOPRAMIDE HCL 5 MG/ML IJ SOLN
INTRAMUSCULAR | Status: AC
  Administered 2012-08-29: 10 mg via INTRAVENOUS
  Filled 2012-08-29: qty 2

## 2012-08-29 MED ORDER — SIMETHICONE 80 MG PO CHEW: 80.0000 mg | CHEWABLE_TABLET | ORAL | Status: DC | PRN

## 2012-08-29 MED ORDER — ONDANSETRON HCL 4 MG/2ML IJ SOLN: 4.0000 mg | INTRAMUSCULAR | Status: DC | PRN

## 2012-08-29 MED ORDER — DIPHENHYDRAMINE HCL 12.5 MG/5ML PO ELIX
12.5000 mg | ORAL_SOLUTION | Freq: Four times a day (QID) | ORAL | Status: DC | PRN
  Filled 2012-08-29: qty 5

## 2012-08-29 MED ORDER — SCOPOLAMINE 1 MG/3DAYS TD PT72
1.0000 | MEDICATED_PATCH | Freq: Once | TRANSDERMAL | Status: DC
  Administered 2012-08-29: 1.5 mg via TRANSDERMAL

## 2012-08-29 MED ORDER — KETOROLAC TROMETHAMINE 30 MG/ML IJ SOLN: 30.0000 mg | Freq: Four times a day (QID) | INTRAMUSCULAR | Status: DC | PRN

## 2012-08-29 MED ORDER — DIBUCAINE 1 % RE OINT
1.0000 "application " | TOPICAL_OINTMENT | RECTAL | Status: DC | PRN
  Filled 2012-08-29: qty 28

## 2012-08-29 MED ORDER — SODIUM CHLORIDE 0.9 % IV SOLN
INTRAVENOUS | Status: DC
  Administered 2012-08-29: 23:00:00 via EPIDURAL
  Filled 2012-08-29 (×6): qty 16

## 2012-08-29 MED ORDER — OXYCODONE-ACETAMINOPHEN 5-325 MG PO TABS
1.0000 | ORAL_TABLET | ORAL | Status: DC | PRN
  Administered 2012-08-30 (×2): 2 via ORAL
  Administered 2012-08-30: 1 via ORAL
  Administered 2012-08-30: 2 via ORAL
  Administered 2012-08-31: 1 via ORAL
  Administered 2012-08-31 (×2): 2 via ORAL
  Filled 2012-08-29 (×7): qty 2

## 2012-08-29 MED ORDER — KETOROLAC TROMETHAMINE 30 MG/ML IJ SOLN
30.0000 mg | Freq: Four times a day (QID) | INTRAMUSCULAR | Status: DC | PRN
  Administered 2012-08-29: 30 mg via INTRAVENOUS

## 2012-08-29 MED ORDER — ACETAMINOPHEN 10 MG/ML IV SOLN
1000.0000 mg | Freq: Four times a day (QID) | INTRAVENOUS | Status: DC | PRN
  Administered 2012-08-29: 1000 mg via INTRAVENOUS
  Filled 2012-08-29 (×2): qty 100

## 2012-08-29 MED ORDER — DIPHENHYDRAMINE HCL 50 MG/ML IJ SOLN: 12.5000 mg | INTRAMUSCULAR | Status: DC | PRN

## 2012-08-29 MED ORDER — DIPHENHYDRAMINE HCL 25 MG PO CAPS
25.0000 mg | ORAL_CAPSULE | ORAL | Status: DC | PRN
  Filled 2012-08-29: qty 1

## 2012-08-29 MED ORDER — CEFAZOLIN SODIUM-DEXTROSE 2-3 GM-% IV SOLR
2.0000 g | INTRAVENOUS | Status: AC
  Administered 2012-08-29: 2 g via INTRAVENOUS

## 2012-08-29 MED ORDER — WITCH HAZEL-GLYCERIN EX PADS: 1.0000 "application " | MEDICATED_PAD | CUTANEOUS | Status: DC | PRN

## 2012-08-29 MED ORDER — HYDROMORPHONE 0.3 MG/ML IV SOLN
INTRAVENOUS | Status: DC
  Administered 2012-08-29: 0.2 mL via INTRAVENOUS
  Administered 2012-08-29: 16:00:00 via INTRAVENOUS
  Administered 2012-08-29: 0.8 mg via INTRAVENOUS
  Administered 2012-08-30 (×2): 0.599 mg via INTRAVENOUS
  Filled 2012-08-29: qty 25

## 2012-08-29 MED ORDER — SODIUM BICARBONATE 8.4 % IV SOLN
INTRAVENOUS | Status: AC
  Filled 2012-08-29: qty 50

## 2012-08-29 MED ORDER — ZOLPIDEM TARTRATE 5 MG PO TABS: 5.0000 mg | ORAL_TABLET | Freq: Every evening | ORAL | Status: DC | PRN

## 2012-08-29 MED ORDER — NALOXONE HCL 1 MG/ML IJ SOLN
1.0000 ug/kg/h | INTRAVENOUS | Status: DC | PRN
  Filled 2012-08-29: qty 2

## 2012-08-29 MED ORDER — LACTATED RINGERS IV SOLN
INTRAVENOUS | Status: DC
  Administered 2012-08-29: 12:00:00 via INTRAVENOUS
  Administered 2012-08-29: 50 mL/h via INTRAVENOUS
  Administered 2012-08-29: 13:00:00 via INTRAVENOUS

## 2012-08-29 MED ORDER — LANOLIN HYDROUS EX OINT: 1.0000 "application " | TOPICAL_OINTMENT | CUTANEOUS | Status: DC | PRN

## 2012-08-29 MED ORDER — CEFAZOLIN SODIUM-DEXTROSE 2-3 GM-% IV SOLR
INTRAVENOUS | Status: AC
  Filled 2012-08-29: qty 50

## 2012-08-29 MED ORDER — MENTHOL 3 MG MT LOZG
1.0000 | LOZENGE | OROMUCOSAL | Status: DC | PRN
  Filled 2012-08-29: qty 9

## 2012-08-29 MED ORDER — PRENATAL MULTIVITAMIN CH
1.0000 | ORAL_TABLET | Freq: Every day | ORAL | Status: DC
  Administered 2012-08-30 – 2012-08-31 (×2): 1 via ORAL
  Filled 2012-08-29 (×4): qty 1

## 2012-08-29 MED ORDER — SCOPOLAMINE 1 MG/3DAYS TD PT72: 1.0000 | MEDICATED_PATCH | Freq: Once | TRANSDERMAL | Status: DC

## 2012-08-29 MED ORDER — ONDANSETRON HCL 4 MG/2ML IJ SOLN
INTRAMUSCULAR | Status: DC | PRN
  Administered 2012-08-29: 4 mg via INTRAVENOUS

## 2012-08-29 SURGICAL SUPPLY — 36 items
APL SKNCLS STERI-STRIP NONHPOA (GAUZE/BANDAGES/DRESSINGS) ×1
BENZOIN TINCTURE PRP APPL 2/3 (GAUZE/BANDAGES/DRESSINGS) ×2 IMPLANT
CLOTH BEACON ORANGE TIMEOUT ST (SAFETY) ×2 IMPLANT
CONTAINER PREFILL 10% NBF 15ML (MISCELLANEOUS) IMPLANT
DRAPE LG THREE QUARTER DISP (DRAPES) ×2 IMPLANT
DRSG OPSITE POSTOP 4X10 (GAUZE/BANDAGES/DRESSINGS) ×2 IMPLANT
DURAPREP 26ML APPLICATOR (WOUND CARE) ×2 IMPLANT
ELECT REM PT RETURN 9FT ADLT (ELECTROSURGICAL) ×2
ELECTRODE REM PT RTRN 9FT ADLT (ELECTROSURGICAL) ×1 IMPLANT
EXTRACTOR VACUUM M CUP 4 TUBE (SUCTIONS) IMPLANT
GLOVE BIO SURGEON STRL SZ7.5 (GLOVE) ×4 IMPLANT
GLOVE BIOGEL PI IND STRL 7.5 (GLOVE) ×1 IMPLANT
GLOVE BIOGEL PI INDICATOR 7.5 (GLOVE) ×1
GOWN PREVENTION PLUS LG XLONG (DISPOSABLE) ×6 IMPLANT
KIT ABG SYR 3ML LUER SLIP (SYRINGE) IMPLANT
NDL HYPO 25X5/8 SAFETYGLIDE (NEEDLE) IMPLANT
NEEDLE HYPO 22GX1.5 SAFETY (NEEDLE) IMPLANT
NEEDLE HYPO 25X5/8 SAFETYGLIDE (NEEDLE) IMPLANT
NS IRRIG 1000ML POUR BTL (IV SOLUTION) ×2 IMPLANT
PACK C SECTION WH (CUSTOM PROCEDURE TRAY) ×2 IMPLANT
PAD OB MATERNITY 4.3X12.25 (PERSONAL CARE ITEMS) ×2 IMPLANT
RETRACTOR WND ALEXIS 25 LRG (MISCELLANEOUS) ×1 IMPLANT
RTRCTR WOUND ALEXIS 25CM LRG (MISCELLANEOUS) ×2
SLEEVE SCD COMPRESS KNEE MED (MISCELLANEOUS) ×1 IMPLANT
STRIP CLOSURE SKIN 1/2X4 (GAUZE/BANDAGES/DRESSINGS) ×2 IMPLANT
SUT CHROMIC 2 0 CT 1 (SUTURE) ×2 IMPLANT
SUT MNCRL AB 3-0 PS2 27 (SUTURE) ×2 IMPLANT
SUT PLAIN 0 NONE (SUTURE) IMPLANT
SUT PLAIN 2 0 XLH (SUTURE) ×2 IMPLANT
SUT VIC AB 0 CT1 36 (SUTURE) ×2 IMPLANT
SUT VIC AB 0 CTX 36 (SUTURE) ×6
SUT VIC AB 0 CTX36XBRD ANBCTRL (SUTURE) ×3 IMPLANT
SYR CONTROL 10ML LL (SYRINGE) IMPLANT
TOWEL OR 17X24 6PK STRL BLUE (TOWEL DISPOSABLE) ×6 IMPLANT
TRAY FOLEY CATH 14FR (SET/KITS/TRAYS/PACK) ×2 IMPLANT
WATER STERILE IRR 1000ML POUR (IV SOLUTION) ×2 IMPLANT

## 2012-08-29 NOTE — Preoperative (Signed)
Beta Blockers   Reason not to administer Beta Blockers:Not Applicable 

## 2012-08-29 NOTE — Transfer of Care (Signed)
Immediate Anesthesia Transfer of Care Note  Patient: Jean [name]  Procedure(s) Performed: Procedure(s) (LRB) with comments: CESAREAN SECTION WITH BILATERAL TUBAL LIGATION (Bilateral) - Repeat  Patient Location: PACU  Anesthesia Type:Epidural  Level of Consciousness: awake, alert  and oriented  Airway & Oxygen Therapy: Patient Spontanous Breathing  Post-op Assessment: Report given to PACU RN  Post vital signs: Reviewed  Complications: No apparent anesthesia complications

## 2012-08-29 NOTE — Op Note (Addendum)
Jean [name]  Indications: 37 1/7 wks with umbilical vein varix and prominent gallbladder for repeat csection and BTL  Pre-operative Diagnosis: Prior Jean, Desire for Sterilzation;umbilical vein varix MFM approval   Post-operative Diagnosis: Prior Jean, Desire for Sterilzation;umbilical vein varix MFM approval  Procedure: Jean SECTION WITH BILATERAL TUBAL LIGATION  Surgeon: Purcell Nails, MD    Assistants: surgical tech  Anesthesia: Regional/Epidural  Anesthesiologist: Eusebio Friendly., MD   Procedure Details  The patient was taken to the operating room after the risks, benefits, complications, treatment options, and expected outcomes were discussed with the patient.  The patient concurred with the proposed plan, giving informed consent which was signed and witnessed. The patient was taken to Operating Room 2, identified as Jean [name] and the procedure verified as C-Section Delivery. A Time Out was held and the above information confirmed.  After induction of anesthesia by obtaining a surgical level via the epidural, the patient was prepped and draped in the usual sterile manner. A Pfannenstiel skin incision was made and carried down through the subcutaneous tissue to the underlying layer of fascia.  The fascia was incised bilaterally and extended transversely bilaterally with the Mayo scissors. Kocher clamps were placed on the inferior aspect of the fascial incision and the underlying rectus muscle was separated from the fascia. The same was done on the superior aspect of the fascial incision.  The peritoneum was identified, entered bluntly and extended manually.  An Jon Gills was placed for self retaining retraction.  The utero-vesical peritoneal reflection was incised transversely and the bladder flap was sharply and bluntly freed from the lower uterine segment. A low transverse uterine incision was made with the scalpel and extended bilaterally  with the bandage scissors.  The infant was delivered in vertex position without difficulty after rupturing membranes with clear fluid noted. After the umbilical cord was clamped and cut, the infant was handed to the awaiting pediatricians.  Cord blood was obtained for evaluation.  The placenta was removed intact and appeared to be within normal limits. No obvious identification of an umbilical vein varix was noted.  The uterus was cleared of all clots and debris. The uterine incision was closed with running interlocking sutures of 0 Vicryl and a second imbricating layer was performed as well.   Bilateral tubes and ovaries appeared to be within normal limits.  Good hemostasis was noted.  Copious irrigation was performed until clear.  The uterus was exteriorized and the left fallopian tube grasped in the midportion with a babcock after carrying it out to its fimbriated end and ligated with two 2-0 plain ties.  The tube was excised and the remaining pedicle cauterized with the bovie. The same was done on the contralateral side.  The peritoneum was repaired with 2-0 chromic via a running suture.  The fascia was reapproximated with a running suture of 0 Vicryl. The subcutaneous tissue was reapproximated with 3 interrupted sutures of 2-0 plain.  The skin was reapproximated with a subcuticular suture of 3-0 monocryl.  Steristrips were applied.  Instrument, sponge, and needle counts were correct prior to abdominal closure and at the conclusion of the case.  The patient was awaiting transfer to the recovery room in good condition.  Findings: Live female infant with Apgars 8 at one minute and 8 at five minutes.  Normal appearing bilateral ovaries and fallopian tubes were noted.  Estimated Blood Loss:  700 ml         Drains: foley to gravity  with 200cc clear urine         Total IV Fluids:         Specimens to Pathology: Placenta and Bilateral Portion of Tubes         Complications:  None; patient tolerated  the procedure well.         Disposition: PACU - hemodynamically stable.         Condition: stable  Attending Attestation: I performed the procedure.

## 2012-08-29 NOTE — H&P (Signed)
Jean [name] is a 32 y.o. female presenting for repeat c/s at 91 1/7wks secondary to MFM recommendations as a result of umbilical vein varix and increased risk for fetal demise.  They discussed options of 37wk delivery with or without amniocentesis or 39wk delivery and pt opted for 37wk delivery without amnio.    History OB History    Grav Para Term Preterm Abortions TAB SAB Ect Mult Living   4 2 1 1 1  1   2      Past Medical History  Diagnosis Date  . Chronic cholecystitis 11/2010  . FHx: cancer   . FHx: hypertension   . FHx: diabetes mellitus   . Yeast infection     NOT FREQ  . CIN I (cervical intraepithelial neoplasia I)   . CIN II (cervical intraepithelial neoplasia II)   . H/O varicella   . H/O chlamydia infection 2005    WAS TREATED  . Hx: UTI (urinary tract infection)     NOT FREQ  . UTI (lower urinary tract infection)   . Anxiety     TAKES ZOLOFT  . Pregnancy induced hypertension 2005    WAS GIVEN BP MEDS  WHILE IN HOSPITAL AND PP  . Depression     TAKES ZOLOFT  . Abnormal Pap smear 2008    COLPO DONE; WAS NORMAL;LAST PAP 06/2010 WAS NORMAL  . Drug abuse     WAS ON METHADONE IN PAST;SUBOXONE  CURRENTLY  . Seizure     ONLY ONLY ONE;D/T medication reaction  . Suboxone maintenance treatment complicating pregnancy, antepartum    Past Surgical History  Procedure Date  . Dilation and curettage of uterus   . Laparoscopic cholecystectomy w/ cholangiography 11/09/2010  . Cesarean section 2005 & 2008  . Leep 06/29/2007  . Wisdom tooth extraction     ALL 4 EXTRACTED   Family History: family history includes Cancer in her mother; Depression in her father and mother; Diabetes in her maternal grandmother; Hyperlipidemia in her mother; Hypertension in her mother; and Other in her maternal grandmother. Social History:  reports that she has been smoking Cigarettes.  She has been smoking about .5 packs per day. She has never used smokeless tobacco. She reports that she uses  illicit drugs (Other-see comments). She reports that she does not drink alcohol.   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 Exam Physical Exam   CTA RRR slightly tachy GRAVID EXT NT  FHT 160s  Prenatal labs: ABO, Rh: --/--/O POS (01/20 1500) Antibody: NEG (01/20 1500) Rubella: 25.1 (07/03 1124) RPR: NON REAC (11/19 1559)  HBsAg: NEGATIVE (07/03 1124)  HIV: NON REACTIVE (07/03 1124)  GBS:   NEGATIVE  Assessment/Plan: P2 at 37 1/7 wks scheduled for repeat csection and bilateral tubal ligation today after MFM consultation secondary to umbilical vein varix with increased risk for fetal demise.  Pt also with h/o methadone use currently on suboxone with recommendations as per Dr. Orvan Falconer of pain mgmt.  Rec was to stop suboxone yest and today and give a PCA postoperatively that  may need to be dosed 1.5 normal dosing.   Purcell Nails 08/29/2012, 10:25 AM

## 2012-08-29 NOTE — Anesthesia Postprocedure Evaluation (Signed)
  Anesthesia Post-op Note  Patient: Jean [name]  Procedure(s) Performed: Procedure(s) (LRB) with comments: CESAREAN SECTION WITH BILATERAL TUBAL LIGATION (Bilateral) - Repeat  Patient Location: Mother/Baby  Anesthesia Type:Epidural  Level of Consciousness: awake  Airway and Oxygen Therapy: Patient Spontanous Breathing  Post-op Pain: mild  Post-op Assessment: Patient's Cardiovascular Status Stable and Respiratory Function Stable  Post-op Vital Signs: stable  Complications: No apparent anesthesia complications

## 2012-08-29 NOTE — Addendum Note (Signed)
Addendum  created 08/29/12 1721 by Renford Dills, CRNA   Modules edited:Notes Section

## 2012-08-29 NOTE — Anesthesia Postprocedure Evaluation (Signed)
Anesthesia Post Note  Patient: Jean [name]  Procedure(s) Performed: Procedure(s) (LRB): CESAREAN SECTION WITH BILATERAL TUBAL LIGATION (Bilateral)  Anesthesia type: Epidural  Patient location: PACU  Post pain: Pain level controlled  Post assessment: Post-op Vital signs reviewed  Last Vitals:  Filed Vitals:   08/29/12 1515  BP:   Pulse:   Temp:   Resp: 20    Post vital signs: Reviewed  Level of consciousness: awake  Complications: No apparent anesthesia complications

## 2012-08-29 NOTE — Anesthesia Procedure Notes (Signed)
Epidural Patient location during procedure: OB Start time: 08/29/2012 12:32 PM  Staffing Anesthesiologist: Angus Seller., Harrell Gave. Performed by: anesthesiologist   Preanesthetic Checklist Completed: patient identified, site marked, surgical consent, pre-op evaluation, timeout performed, IV checked, risks and benefits discussed and monitors and equipment checked  Epidural Patient position: sitting Prep: site prepped and draped and DuraPrep Patient monitoring: continuous pulse ox and blood pressure Approach: midline Injection technique: LOR air and LOR saline  Needle:  Needle type: Tuohy  Needle gauge: 17 G Needle length: 9 cm and 9 Needle insertion depth: 4 cm Catheter type: closed end flexible Catheter size: 19 Gauge Catheter at skin depth: 10 cm Test dose: negative  Assessment Events: blood not aspirated, injection not painful, no injection resistance, negative IV test and no paresthesia  Additional Notes Patient identified.  Risk benefits discussed including failed block, incomplete pain control, headache, nerve damage, paralysis, blood pressure changes, nausea, vomiting, reactions to medication both toxic or allergic, and postpartum back pain.  Patient expressed understanding and wished to proceed.  All questions were answered.  Sterile technique used throughout procedure and epidural site dressed with sterile barrier dressing. No paresthesia or other complications noted.The patient did not experience any signs of intravascular injection such as tinnitus or metallic taste in mouth nor signs of intrathecal spread such as rapid motor block. Please see nursing notes for vital signs.

## 2012-08-29 NOTE — Addendum Note (Signed)
Addendum  created 08/29/12 1540 by Eusebio Friendly., MD   Modules edited:Orders, PRL Based Order Sets

## 2012-08-30 ENCOUNTER — Encounter (HOSPITAL_COMMUNITY): Payer: Self-pay | Admitting: *Deleted

## 2012-08-30 LAB — CBC
HCT: 27.3 % — ABNORMAL LOW (ref 36.0–46.0)
MCH: 31.6 pg (ref 26.0–34.0)
MCHC: 33.7 g/dL (ref 30.0–36.0)
MCV: 93.8 fL (ref 78.0–100.0)
RDW: 13.3 % (ref 11.5–15.5)

## 2012-08-30 MED ORDER — BUPRENORPHINE HCL 2 MG SL SUBL
4.0000 mg | SUBLINGUAL_TABLET | Freq: Two times a day (BID) | SUBLINGUAL | Status: DC
  Administered 2012-08-30 – 2012-08-31 (×3): 4 mg via SUBLINGUAL
  Filled 2012-08-30 (×4): qty 2

## 2012-08-30 NOTE — Progress Notes (Signed)
Pt not in room Per RN is in NICU,  RN states pt has not needed PCA in a while  SLillard, CNM

## 2012-08-30 NOTE — Progress Notes (Signed)
Subjective: Postpartum Day 1: Cesarean Delivery Patient reports tolerating PO.  Adequate pain control for now. She complains of numbness in her left leg. She still has a Foley catheter in place.  Objective: Vital signs in last 24 hours: Temp:  [97.4 F (36.3 C)-98.9 F (37.2 C)] 98.2 F (36.8 C) (01/23 0830) Pulse Rate:  [57-115] 67  (01/23 0830) Resp:  [16-20] 18  (01/23 0830) BP: (94-117)/(49-78) 103/58 mmHg (01/23 0830) SpO2:  [94 %-100 %] 97 % (01/23 0830) Weight:  [169 lb (76.658 kg)] 169 lb (76.658 kg) (01/22 1520)  Physical Exam:  General: alert, cooperative and no distress Lochia: appropriate Uterine Fundus: firm Incision: healing well, no significant drainage DVT Evaluation: No evidence of DVT seen on physical exam.   Basename 08/30/12 0510 08/27/12 1500  HGB 9.2* 10.8*  HCT 27.3* 32.0*    Assessment/Plan: Status post Cesarean section and tubal sterilization procedure. Doing well postoperatively.   Continue current care.  Postoperative anemia.  History of substance abuse that was treated with Suboxone during the antenatal period. I spoke with Dr. Orvan Falconer [phone]). He recommends that we start the patient back on Suboxone 4 mg twice each day (Suboxone not available here in the hospital. The patient does not have her own medication with her. Buprenorphine alone without naloxone is available. The patient says she has taken this in the past and has done well. The medication plan was discussed with the pharmacy). Dr. Orvan Falconer also recommends that we continue oxycodone as well as ibuprofen for pain management. He wants to see the patient back in his office in one week.  The patient is pumping her breasts.  The patient wants to go home tomorrow.  Janine Limbo 08/30/2012, 9:49 AM

## 2012-08-30 NOTE — Progress Notes (Signed)
I reviewed medication use for this patient with Dr. Orvan Falconer. He recommends that the patient take Suboxone 4 mg twice each day until she no longer needs narcotic pain medication for postoperative discomfort. In the past the patient has taken 8 mg in the morning and 4 mg in the evening. He does not want her to go back on the 8 mg morning dose for now.  Dr. Stefano Gaul

## 2012-08-31 MED ORDER — OXYCODONE-ACETAMINOPHEN 5-325 MG PO TABS: ORAL_TABLET | ORAL | Status: DC

## 2012-08-31 MED ORDER — IBUPROFEN 600 MG PO TABS: 600.0000 mg | ORAL_TABLET | Freq: Four times a day (QID) | ORAL | Status: DC | PRN

## 2012-08-31 MED ORDER — BUPRENORPHINE HCL-NALOXONE HCL 8-2 MG SL FILM: ORAL_FILM | SUBLINGUAL | Status: DC

## 2012-08-31 NOTE — Clinical Social Work Maternal (Signed)
Clinical Social Work Department PSYCHOSOCIAL ASSESSMENT - MATERNAL/CHILD 08/30/2012  Patient:  Jean [name]  Account Number:  [id number]  Admit Date:  08/29/2012  Childs Name:   Jean [name]    Clinical Social Worker:  Alexxus Sobh, LCSW   Date/Time:  08/30/2012 01:00 PM  Date Referred:  08/30/2012   Referral source  NICU     Referred reason  NICU  Behavioral Health Issues  Substance Abuse   Other referral source:    I:  FAMILY / HOME ENVIRONMENT Child's legal guardian:  PARENT  Guardian - Name Guardian - Age Guardian - Address  Jean [name] 31 [address] [place], [place] [address]   Other household support members/support persons Name Relationship DOB  Jean [name] DAUGHTER 8  Jean [name] DAUGHTER 5  Jean [name] MOTHER   Jean [name] FATHER    Other support:   [name] states her parents and her best friend, Jean [name], are her greatest support people.  FOB not involved and unaware of baby's birth at this time.    II  PSYCHOSOCIAL DATA Information Source:  Patient Interview  Financial and Community Resources Employment:   Financial resources:  Medicaid If Medicaid - County:  [place]  School / Grade:   Maternity Care Coordinator / Child Services Coordination / Early Interventions:  Cultural issues impacting care:   none known    III  STRENGTHS Strengths  Adequate Resources  Compliance with medical plan  Home prepared for Child (including basic supplies)  Other - See comment  Supportive family/friends  Understanding of illness   Strength comment:  Pediatric follow up will be with Dr. Brett   IV  RISK FACTORS AND CURRENT PROBLEMS Current Problem:  YES   Risk Factor & Current Problem Patient Issue Family Issue Risk Factor / Current Problem Comment  Substance Abuse Y N [name] has a hx of narcotic addiction and is currently taking Suboxone  Mental Illness Y N [name] takes Zoloft for Anxiety   N N     V  SOCIAL WORK ASSESSMENT CSW met with [name] in her first floor  room/101 to introduce myself, complete assessment and evaluate how she is coping with baby's admission to NICU.  She was pleasant and states she is coping well.  She reports to CSW that baby had "fluid on his lungs" as the reason for his admission to NICU.  CSW inquired about her Suboxone treatment and she states she had a back injury approximately 6 years ago and became addicted to narcotics.  She states she started methadone treatment 3 years ago and has now switched to Suboxone.  She states she receives treatment from Dr. Campbell at [facility].  She states an understanding of the withdrawal process baby may go through.  A friend was with her and she stated that she could stay while we talked.  The friend looked familiar to CSW and stated that she had a baby in the NICU for withdrawal 3 years ago.  [name] states her friend will be an especially good support having already gone through this experience.  [name] reports having all supplies for baby at home and states she will be able to get here to visit baby after her discharge, but appreciated the offer for gas cards.  Her friend states she received gas cards when her baby was in the NICU.  [name] plans to call WIC and knows that she has to notify her Medicaid worker of baby's birth.  [name] reports that FOB is not involved and   that he does not know the baby has been born.  [name] states that she and her two daughters live with her parents and that they are caring for the girls while she is in the hospital.  [name] takes Zoloft for Anxiety.  We discussed emotions often experienced when a baby is in the NICU.  CSW explained support services offered by NICU CSW.      VI SOCIAL WORK PLAN Social Work Plan  Psychosocial Support/Ongoing Assessment of Needs   Type of pt/family education:   NICU support services  Common emotions related to the NICU experience   If child protective services report - county:   If child protective services report - date:     Information/referral to community resources comment:   No referral needs identified at this time.   Other social work plan:   

## 2012-08-31 NOTE — Discharge Summary (Signed)
  Obstetric Discharge Summary  Reason for Admission: cesarean section scheduled at 37 +1 weeks per MFM recommendation 2nd to umbilical cord varix Prenatal Procedures: none Intrapartum Procedures: cesarean: low cervical, transverse with BTL  by Osborn Coho MD Postpartum Procedures: none Complications-Operative and Postpartum: none  Hemoglobin  Date Value Range Status  08/30/2012 9.2* 12.0 - 15.0 g/dL Final     HCT  Date Value Range Status  08/30/2012 27.3* 36.0 - 46.0 % Final    Discharge Diagnoses: Term Pregnancy-delivered  Discharge Information:  Date: 08/31/2012 Activity: unrestricted Diet: routine Medications: Ibuprofen # 30 with 1 refill, Percocet #30 with 0 refills and instructions not to exceed 6 tablets per day and Suboxone 4 mg sublingual BID per pain management MD Condition: stable   Instructions: refer to practice specific booklet Discharge to: home   Newborn Data: Live born  Information for the patient's newborn:  Tahiri, Shareef [[id number]]  female   Home with mother.  Tarick Parenteau A MD 08/31/2012, 10:15 AM

## 2012-09-04 ENCOUNTER — Telehealth: Payer: Self-pay | Admitting: Obstetrics and Gynecology

## 2012-09-04 NOTE — Telephone Encounter (Signed)
Tc from pt per telephone call. Pt instructed not to take steri strips;they will fall off on their own. Pt voices understanding.

## 2012-09-04 NOTE — Telephone Encounter (Signed)
Lm on vm to cb per telephone call.  

## 2012-09-18 ENCOUNTER — Inpatient Hospital Stay (HOSPITAL_COMMUNITY): Admission: AD | Admit: 2012-09-18 | Payer: Self-pay | Source: Ambulatory Visit | Admitting: Obstetrics and Gynecology

## 2012-10-05 ENCOUNTER — Encounter: Payer: Medicaid Other | Admitting: Obstetrics and Gynecology

## 2013-04-09 IMAGING — US US OB DETAIL+14 WK
1 series · 16 of 28 positions shown · non-contrast
Comparison: none

OBSTETRICS REPORT
                      (Signed Final 07/26/2012 [DATE])

Service(s) Provided
 US OB DETAIL + 14 WK                                  76811.0
Indications
 Detailed fetal anatomic survey
 Fetal abnormality - other known or suspected
 (umbilical vein varix)
 Poor obstetric history: Previous preeclampsia /
 eclampsia/gestational HTN
 Poor obstetric history: Previous preterm delivery
 (36 weeks)
 History of cesarean delivery, currently pregnant      654.20,
 Suboxone use
 H/O anxiety and depression (treated with zoloft)
 Cigarette smoker
 Previous cervical surgery (colposcopy)
Fetal Evaluation
 Num Of Fetuses:    1
 Fetal Heart Rate:  148                          bpm
 Cardiac Activity:  Observed
 Presentation:      Cephalic
 Placenta:          Posterior, above cervical
                    os
 P. Cord            Visualized
 Insertion:
 Amniotic Fluid
 AFI FV:      Subjectively upper-normal
 AFI Sum:     22.4    cm       86  %Tile     Larg Pckt:    6.88  cm
 RUQ:   4.89    cm   RLQ:    4.91   cm    LUQ:   6.88    cm   LLQ:    5.72   cm
Biometry
 BPD:     87.2  mm     G. Age:  35w 1d                CI:         82.0   70 - 86
 OFD:    106.4  mm                                    FL/HC:      20.2   19.1 -
 HC:     308.2  mm     G. Age:  34w 3d       69  %    HC/AC:      1.10   0.96 -
 AC:     280.6  mm     G. Age:  32w 0d       44  %    FL/BPD:     71.6   71 - 87
 FL:      62.4  mm     G. Age:  32w 2d       39  %    FL/AC:      22.2   20 - 24
 HUM:     57.9  mm     G. Age:  33w 4d       79  %
 CER:     41.5  mm     G. Age:  35w 4d       87  %
 Est. FW:    1311  gm      4 lb 7 oz     64  %
Gestational Age
 LMP:           32w 2d        Date:  12/13/11                 EDD:   09/18/12
 U/S Today:     33w 3d                                        EDD:   09/10/12
 Best:          32w 2d     Det. By:  LMP  (12/13/11)          EDD:   09/18/12
Anatomy
 Cranium:          Appears normal         Aortic Arch:      Appears normal
 Fetal Cavum:      Appears normal         Ductal Arch:      Not well visualized
 Ventricles:       Appears normal         Diaphragm:        Appears normal
 Choroid Plexus:   Appears normal         Stomach:          Appears normal, left
                                                            sided
 Cerebellum:       Appears normal         Abdomen:          Abnormal, see
                                                            comments
 Posterior Fossa:  Appears normal         Abdominal Wall:   Appears nml (cord
                                                            insert, abd wall)
 Nuchal Fold:      Not applicable (>20    Cord Vessels:     Appears normal (3
                   wks GA)                                  vessel cord)
 Face:             Appears normal         Kidneys:          Appear normal
                   (orbits and profile)
 Lips:             Appears normal         Bladder:          Appears normal
 Palate:           Not well visualized    Spine:            Appears normal
 Heart:            Appears normal         Lower             Visualized
                   (4CH, axis, and        Extremities:
                   situs)
 RVOT:             Appears normal         Upper             Visualized
                                          Extremities:
 LVOT:             Appears normal
 Other:  Fetus appears to be a male. Nasal bone visualized. Technically
         difficult due to advanced GA and fetal position.
Targeted Anatomy
 Fetal Central Nervous System
 Cisterna Magna:
Cervix Uterus Adnexa
 Cervix:       Not visualized (advanced GA >73wks)
Impression
INDICATION: 31 yr old [id number] at 88w8d with narcotic
 dependence on suboxone and history of preeclampsia for
 fetal ultrasound secondary to finding of umbilical vein varix on
 outside ultrasound.

[Series 1: us ob detail+14 wk · 0.23mm/px · 16 of 84 slices shown]
[im 1/84]
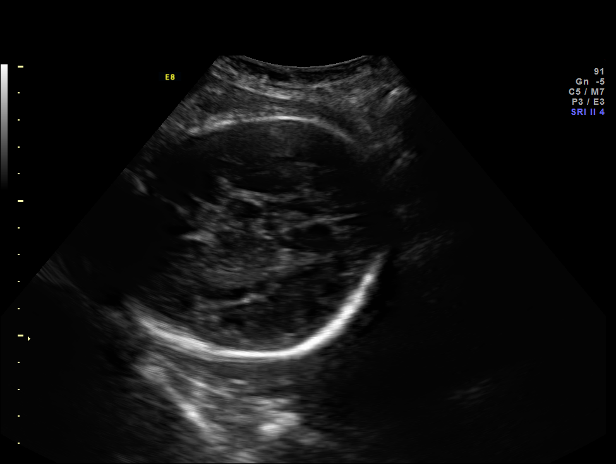
[im 7/84]
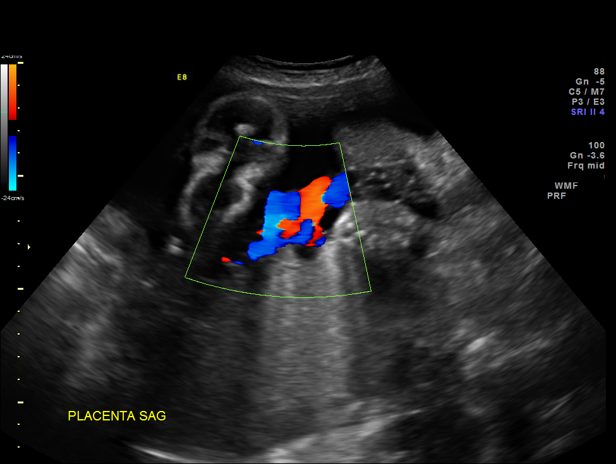
[im 13/84]
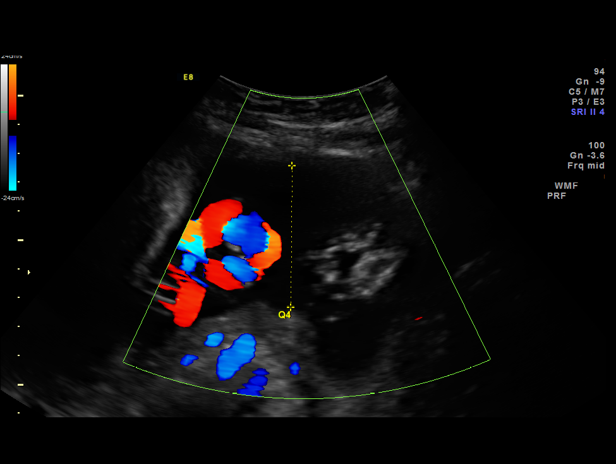
[im 19/84]
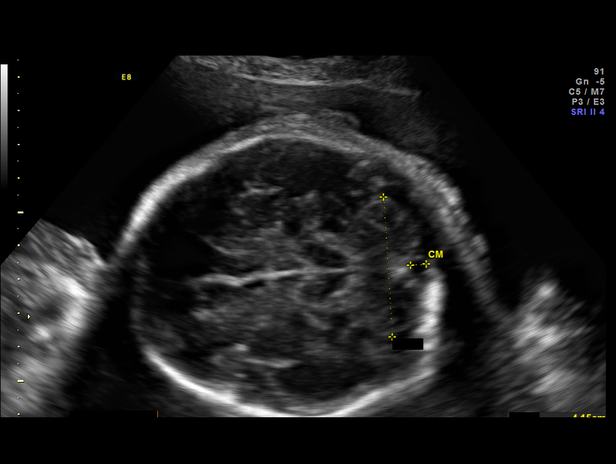
[im 22/84]
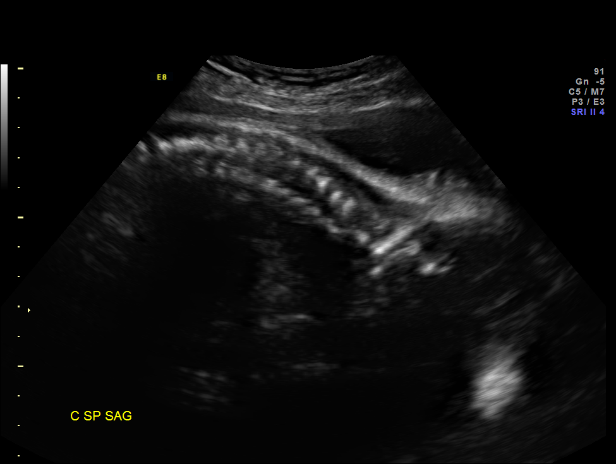
[im 28/84]
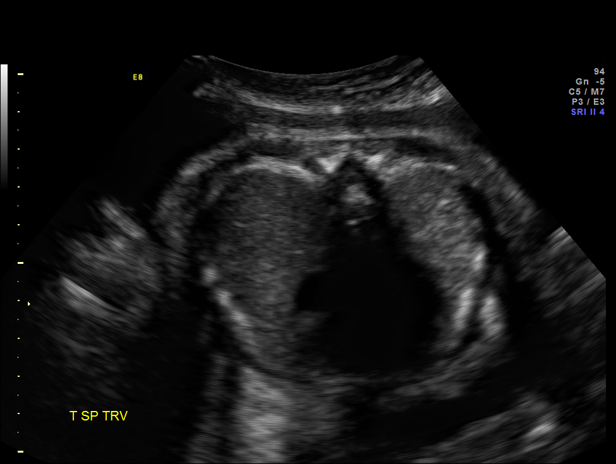
[im 34/84]
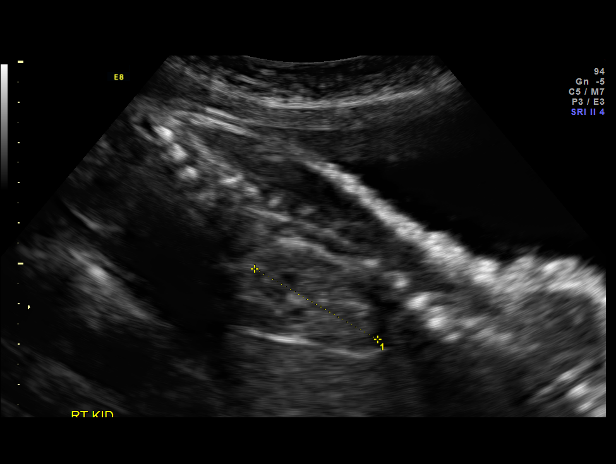
[im 40/84]
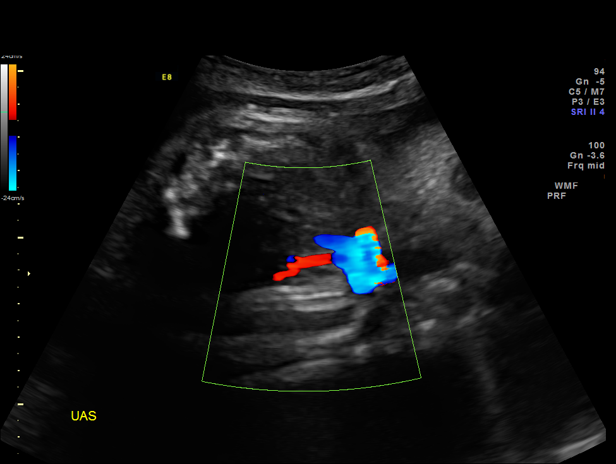
[im 44/84]
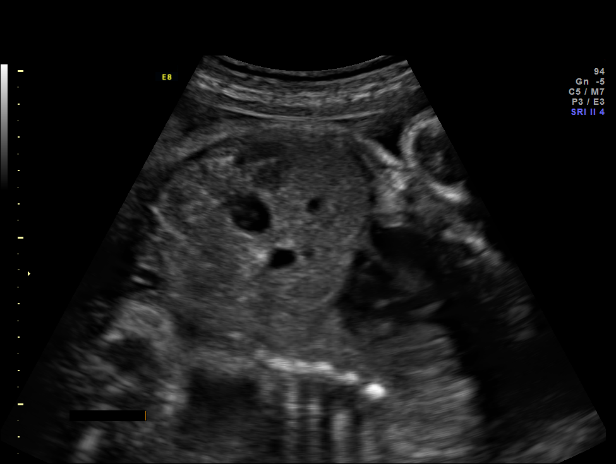
[im 50/84]
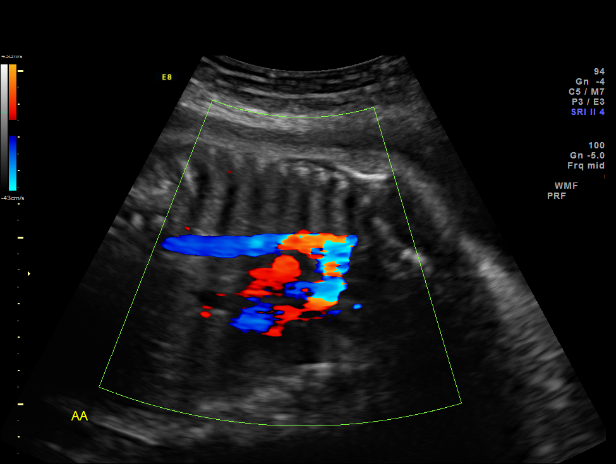
[im 56/84]
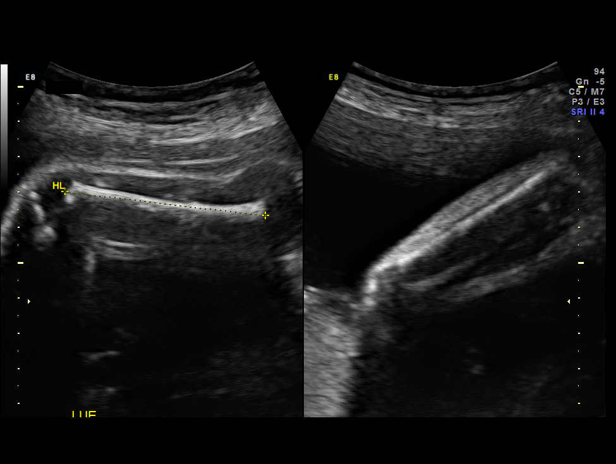
[im 62/84]
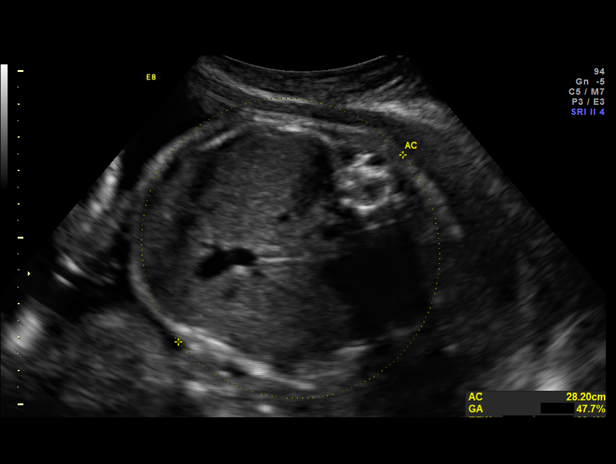
[im 65/84]
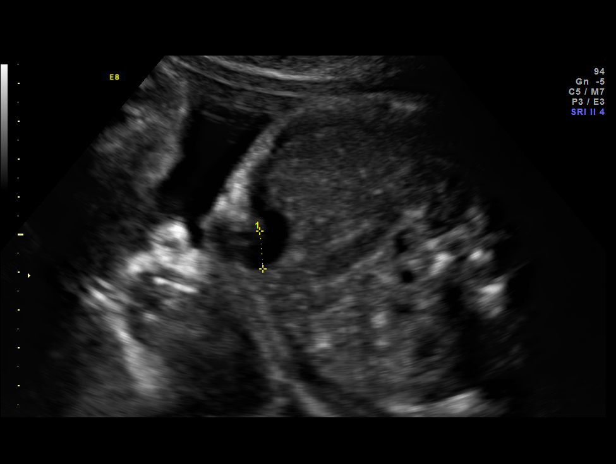
[im 71/84]
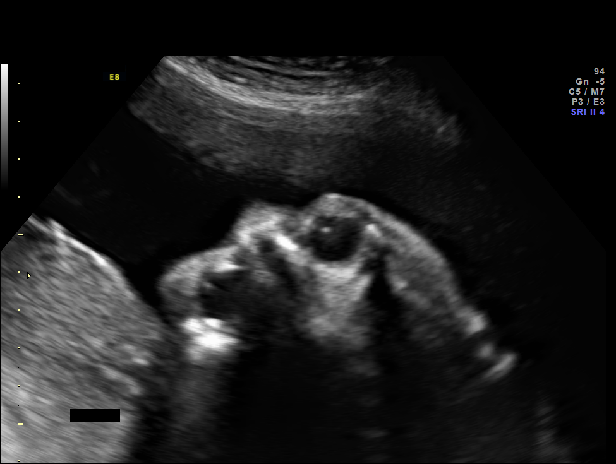
[im 77/84]
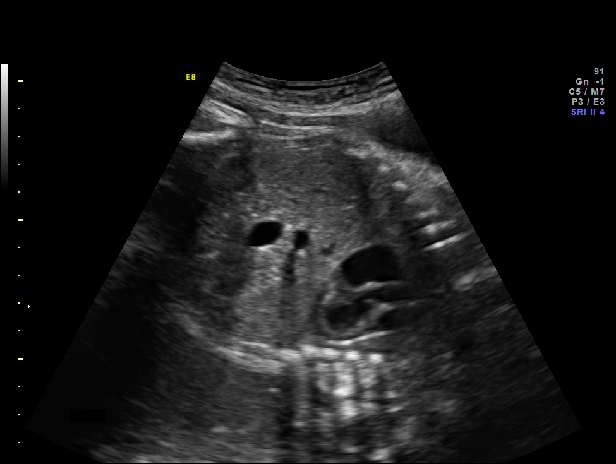
[im 84/84]
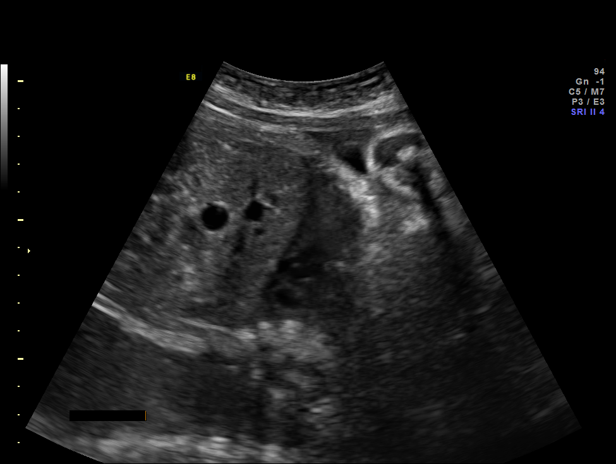

[16 of 28 positions shown; findings below may reference images not displayed]

FINDINGS: 1. Single intrauterine pregnancy.
 2. Estimated fetal weight is in the 64th%.
 3. Posterior placenta without evidence of previa.
 4. Normal amniotic fluid index although increased for
 gestational age.
 5. An intraabdominal umbilical vein varix is seen measuring
 1cm.
 6. There is a midline cystic structure in the abdomen that is
 consistent with a prominent gall bladder.
 7. The views of the palate, ductal arch, hands, and ankles are
 limited.
 8. The remainder of the limited anatomy survey is normal.
Recommendations

 1. Appropriate fetal growth.
 2. Cystic structure in the abdomen:
 - likely prominent gall bladder
 - will continue to follow on ultrasound
 3. History of preeclampsia:
 - discussed increased risk of recurrence
 - recommend close surveillance for the development of
 signs/symptoms of preeclampsia
 4. Suboxone use:
 - previously counseled
 - recommend fetal growth every 4 weeks
 - recommend NICU consult to discuss risks of neonatal
 abstinence syndrome
 5. Umbilical vein varix:
 - see consult letter
 - recommend antenatal testing- had reactive NST today
 6. Increased amniotic fluid index:
 - patient had normal glucola
 - will reevaluate in 1 week

 questions or concerns.

## 2013-09-25 ENCOUNTER — Encounter (HOSPITAL_COMMUNITY): Payer: Self-pay | Admitting: Emergency Medicine

## 2013-09-25 ENCOUNTER — Emergency Department (HOSPITAL_COMMUNITY)
Admission: EM | Admit: 2013-09-25 | Discharge: 2013-09-25 | Disposition: A | Discharge status: Home / Self Care | Payer: Medicaid Other | Attending: Emergency Medicine | Admitting: Emergency Medicine

## 2013-09-25 DIAGNOSIS — Z792 Long term (current) use of antibiotics: Secondary | ICD-10-CM | POA: Insufficient documentation

## 2013-09-25 DIAGNOSIS — L089 Local infection of the skin and subcutaneous tissue, unspecified: Secondary | ICD-10-CM

## 2013-09-25 DIAGNOSIS — Z79899 Other long term (current) drug therapy: Secondary | ICD-10-CM | POA: Insufficient documentation

## 2013-09-25 DIAGNOSIS — Z8719 Personal history of other diseases of the digestive system: Secondary | ICD-10-CM | POA: Insufficient documentation

## 2013-09-25 DIAGNOSIS — Z8669 Personal history of other diseases of the nervous system and sense organs: Secondary | ICD-10-CM | POA: Insufficient documentation

## 2013-09-25 DIAGNOSIS — Y939 Activity, unspecified: Secondary | ICD-10-CM | POA: Insufficient documentation

## 2013-09-25 DIAGNOSIS — S1092XA Blister (nonthermal) of unspecified part of neck, initial encounter: Secondary | ICD-10-CM

## 2013-09-25 DIAGNOSIS — F17200 Nicotine dependence, unspecified, uncomplicated: Secondary | ICD-10-CM | POA: Insufficient documentation

## 2013-09-25 DIAGNOSIS — Z8614 Personal history of Methicillin resistant Staphylococcus aureus infection: Secondary | ICD-10-CM | POA: Insufficient documentation

## 2013-09-25 DIAGNOSIS — S0082XA Blister (nonthermal) of other part of head, initial encounter: Secondary | ICD-10-CM

## 2013-09-25 DIAGNOSIS — X58XXXA Exposure to other specified factors, initial encounter: Secondary | ICD-10-CM | POA: Insufficient documentation

## 2013-09-25 DIAGNOSIS — Z8619 Personal history of other infectious and parasitic diseases: Secondary | ICD-10-CM | POA: Insufficient documentation

## 2013-09-25 DIAGNOSIS — F3289 Other specified depressive episodes: Secondary | ICD-10-CM | POA: Insufficient documentation

## 2013-09-25 DIAGNOSIS — S0002XA Blister (nonthermal) of scalp, initial encounter: Secondary | ICD-10-CM

## 2013-09-25 DIAGNOSIS — R221 Localized swelling, mass and lump, neck: Principal | ICD-10-CM

## 2013-09-25 DIAGNOSIS — R220 Localized swelling, mass and lump, head: Secondary | ICD-10-CM

## 2013-09-25 DIAGNOSIS — Z8742 Personal history of other diseases of the female genital tract: Secondary | ICD-10-CM | POA: Insufficient documentation

## 2013-09-25 DIAGNOSIS — F329 Major depressive disorder, single episode, unspecified: Secondary | ICD-10-CM | POA: Insufficient documentation

## 2013-09-25 DIAGNOSIS — R000 Tachycardia, unspecified: Secondary | ICD-10-CM | POA: Insufficient documentation

## 2013-09-25 DIAGNOSIS — F411 Generalized anxiety disorder: Secondary | ICD-10-CM | POA: Insufficient documentation

## 2013-09-25 DIAGNOSIS — S00521A Blister (nonthermal) of lip, initial encounter: Secondary | ICD-10-CM

## 2013-09-25 DIAGNOSIS — Z87440 Personal history of urinary (tract) infections: Secondary | ICD-10-CM | POA: Insufficient documentation

## 2013-09-25 DIAGNOSIS — Y929 Unspecified place or not applicable: Secondary | ICD-10-CM | POA: Insufficient documentation

## 2013-09-25 LAB — CBC WITH DIFFERENTIAL/PLATELET
BASOS ABS: 0 10*3/uL (ref 0.0–0.1)
Basophils Relative: 0 % (ref 0–1)
EOS PCT: 1 % (ref 0–5)
Eosinophils Absolute: 0.1 10*3/uL (ref 0.0–0.7)
HEMATOCRIT: 40 % (ref 36.0–46.0)
Hemoglobin: 13.9 g/dL (ref 12.0–15.0)
LYMPHS ABS: 2 10*3/uL (ref 0.7–4.0)
LYMPHS PCT: 22 % (ref 12–46)
MCH: 30.3 pg (ref 26.0–34.0)
MCHC: 34.8 g/dL (ref 30.0–36.0)
MCV: 87.3 fL (ref 78.0–100.0)
MONO ABS: 0.6 10*3/uL (ref 0.1–1.0)
Monocytes Relative: 6 % (ref 3–12)
NEUTROS ABS: 6.5 10*3/uL (ref 1.7–7.7)
Neutrophils Relative %: 71 % (ref 43–77)
PLATELETS: 247 10*3/uL (ref 150–400)
RBC: 4.58 MIL/uL (ref 3.87–5.11)
RDW: 12.6 % (ref 11.5–15.5)
WBC: 9.2 10*3/uL (ref 4.0–10.5)

## 2013-09-25 LAB — URINE MICROSCOPIC-ADD ON

## 2013-09-25 LAB — URINALYSIS, ROUTINE W REFLEX MICROSCOPIC
GLUCOSE, UA: NEGATIVE mg/dL
Hgb urine dipstick: NEGATIVE
Ketones, ur: 15 mg/dL — AB
NITRITE: NEGATIVE
PH: 5.5 (ref 5.0–8.0)
Protein, ur: NEGATIVE mg/dL
Specific Gravity, Urine: 1.034 — ABNORMAL HIGH (ref 1.005–1.030)
Urobilinogen, UA: 1 mg/dL (ref 0.0–1.0)

## 2013-09-25 LAB — COMPREHENSIVE METABOLIC PANEL
ALT: 12 U/L (ref 0–35)
AST: 17 U/L (ref 0–37)
Albumin: 3.9 g/dL (ref 3.5–5.2)
Alkaline Phosphatase: 70 U/L (ref 39–117)
BILIRUBIN TOTAL: 0.4 mg/dL (ref 0.3–1.2)
BUN: 15 mg/dL (ref 6–23)
CALCIUM: 9.6 mg/dL (ref 8.4–10.5)
CHLORIDE: 99 meq/L (ref 96–112)
CO2: 25 meq/L (ref 19–32)
Creatinine, Ser: 0.57 mg/dL (ref 0.50–1.10)
GLUCOSE: 98 mg/dL (ref 70–99)
Potassium: 3.2 mEq/L — ABNORMAL LOW (ref 3.7–5.3)
SODIUM: 139 meq/L (ref 137–147)
Total Protein: 8 g/dL (ref 6.0–8.3)

## 2013-09-25 MED ORDER — CEPHALEXIN 500 MG PO CAPS: 500.0000 mg | ORAL_CAPSULE | Freq: Four times a day (QID) | ORAL | Status: DC

## 2013-09-25 MED ORDER — CLINDAMYCIN PHOSPHATE 600 MG/50ML IV SOLN
600.0000 mg | Freq: Once | INTRAVENOUS | Status: AC
  Administered 2013-09-25: 600 mg via INTRAVENOUS
  Filled 2013-09-25: qty 50

## 2013-09-25 MED ORDER — SODIUM CHLORIDE 0.9 % IV BOLUS (SEPSIS)
500.0000 mL | Freq: Once | INTRAVENOUS | Status: AC
  Administered 2013-09-25: 500 mL via INTRAVENOUS

## 2013-09-25 NOTE — ED Provider Notes (Signed)
CSN: [id number]     Arrival date & time 09/25/13  1404 History   First MD Initiated Contact with Patient 09/25/13 1713     Chief Complaint  Patient presents with  . Abscess    (Consider location/radiation/quality/duration/timing/severity/associated sxs/prior Treatment) HPI Comments: Patient is a 33 year old female who presents to the emergency department for R upper lip swelling. Patient states that her lip swelling has been gradually worsening over the last 6 days. Patient states that she noticed a small bump on the medial aspect of her R upper lip this past Saturday. She saw her doctor at this time who told her it was a "cold sore" and prescribe her acyclovir. Patient states that she took the acyclovir, but symptoms continued to worsen. She saw her primary care doctor 2 days later, on Monday, who said that "it takes some time to see improvement" and subsequently d/c'd her. Patient was then seen today at her [facility] clinic where concern was raised for bacterial infection. Patient endorses pain controlled with ibuprofen as well as intermittent purulent drainage from her upper lip. She further endorses a history of MRSA. Patient denies associated fever, inability to swallow or drooling, shortness of breath, neck pain or stiffness, oral bleeding, and trismus.   Patient was given a prescription for Bactrim by the physician at her Suboxone clinic, but told to come to the emergency department for evaluation if possible prior to having script filled.  Patient is a 33 y.o. female presenting with abscess. The history is provided by the patient. No language interpreter was used.  Abscess Associated symptoms: no fever, no nausea and no vomiting     Past Medical History  Diagnosis Date  . Chronic cholecystitis 11/2010  . FHx: cancer   . FHx: hypertension   . FHx: diabetes mellitus   . Yeast infection     NOT FREQ  . CIN I (cervical intraepithelial neoplasia I)   . CIN II (cervical intraepithelial  neoplasia II)   . H/O varicella   . H/O chlamydia infection 2005    WAS TREATED  . Hx: UTI (urinary tract infection)     NOT FREQ  . UTI (lower urinary tract infection)   . Anxiety     TAKES ZOLOFT  . Pregnancy induced hypertension 2005    WAS GIVEN BP MEDS  WHILE IN HOSPITAL AND PP  . Depression     TAKES ZOLOFT  . Abnormal Pap smear 2008    COLPO DONE; WAS NORMAL;LAST PAP 06/2010 WAS NORMAL  . Drug abuse     WAS ON METHADONE IN PAST;SUBOXONE  CURRENTLY  . Seizure     ONLY ONLY ONE;D/T medication reaction  . Suboxone maintenance treatment complicating pregnancy, antepartum    Past Surgical History  Procedure Laterality Date  . Dilation and curettage of uterus    . Laparoscopic cholecystectomy w/ cholangiography  11/09/2010  . Cesarean section  2005 & 2008  . Leep  06/29/2007  . Wisdom tooth extraction      ALL 4 EXTRACTED  . Cesarean section with bilateral tubal ligation  08/29/2012    Procedure: CESAREAN SECTION WITH BILATERAL TUBAL LIGATION;  Surgeon: Delice Lesch, MD;  Location: [facility];  Service: Obstetrics;  Laterality: Bilateral;  Repeat   Family History  Problem Relation Age of Onset  . Hypertension Mother   . Diabetes Maternal Grandmother     IDDM  . Cancer Mother     STAGE IV;RECENT DX  . Other Maternal Grandmother  VARICOSE VEINS  . Hyperlipidemia Mother   . Depression Mother     ON MEDS  . Depression Father     ON MEDS   History  Substance Use Topics  . Smoking status: Current Every Day Smoker -- 0.50 packs/day    Types: Cigarettes  . Smokeless tobacco: Never Used     Comment: not ready to quit.  MFM consult feels attempts will exacerbate narcotic substiitiution therapy  . Alcohol Use: No   OB History   Grav Para Term Preterm Abortions TAB SAB Ect Mult Living   5 4 3 1 1 1  0   4      Review of Systems  Constitutional: Negative for fever.  HENT: Positive for facial swelling and mouth sores. Negative for drooling and trouble swallowing.     Respiratory: Negative for shortness of breath.   Gastrointestinal: Negative for nausea and vomiting.  All other systems reviewed and are negative.     Allergies  Review of patient's allergies indicates no known allergies.  Home Medications   Current Outpatient Rx  Name  Route  Sig  Dispense  Refill  . acetaminophen (TYLENOL) 500 MG tablet   Oral   Take 500 mg by mouth every 6 (six) hours as needed.          Marland Kitchen acyclovir (ZOVIRAX) 800 MG tablet   Oral   Take 800 mg by mouth 4 (four) times daily.         . benzocaine (ORAJEL) 10 % mucosal gel   Mouth/Throat   Use as directed 1 application in the mouth or throat as needed for mouth pain.         . Buprenorphine HCl-Naloxone HCl (SUBOXONE) 8-2 MG FILM   Sublingual   Place 0.25 each under the tongue 5 (five) times daily.         . Camphor-Phenol (CAMPHO-PHENIQUE) 10.8-4.7 % GEL   Apply externally   Apply 1 application topically daily as needed (for irritation).         . Cold Sore Products (BLISTEX EX)   Apply externally   Apply 1 application topically daily as needed (for dry lips).         . Docosanol (ABREVA) 10 % CREA   Apply externally   Apply 1 application topically daily as needed (for cold sores).         . DULoxetine (CYMBALTA) 20 MG capsule   Oral   Take 20 mg by mouth daily.         Marland Kitchen ibuprofen (ADVIL,MOTRIN) 200 MG tablet   Oral   Take 400 mg by mouth every 6 (six) hours as needed for moderate pain.         Marland Kitchen lisdexamfetamine (VYVANSE) 50 MG capsule   Oral   Take 50 mg by mouth daily.         . mupirocin cream (BACTROBAN) 2 %   Topical   Apply 1 application topically once.         . promethazine (PHENERGAN) 25 MG tablet   Oral   Take 25 mg by mouth every 6 (six) hours as needed for nausea or vomiting.         . cephALEXin (KEFLEX) 500 MG capsule   Oral   Take 1 capsule (500 mg total) by mouth 4 (four) times daily.   40 capsule   0    BP 118/84  Pulse 102  Temp(Src)  97.8 F (36.6 C) (Oral)  Resp 18  Ht 5\' 9"  (1.753 m)  Wt 121 lb (54.885 kg)  BMI 17.86 kg/m2  SpO2 100%  Physical Exam  Nursing note and vitals reviewed. Constitutional: She is oriented to person, place, and time. She appears well-developed and well-nourished. No distress.  HENT:  Head: Normocephalic and atraumatic.  Mouth/Throat: Uvula is midline, oropharynx is clear and moist and mucous membranes are normal. No trismus in the jaw. No uvula swelling. No oropharyngeal exudate.    Eyes: Conjunctivae and EOM are normal. Pupils are equal, round, and reactive to light. No scleral icterus.  Neck: Normal range of motion. Neck supple.  Cardiovascular: Regular rhythm and intact distal pulses.  Tachycardia present.   Tachy to 115  Pulmonary/Chest: Effort normal. No respiratory distress.  Musculoskeletal: Normal range of motion.  Neurological: She is alert and oriented to person, place, and time.  Skin: Skin is warm and dry. No rash noted. She is not diaphoretic. No erythema. No pallor.  Psychiatric: She has a normal mood and affect. Her behavior is normal.    ED Course  Procedures (including critical care time) Labs Review Labs Reviewed  COMPREHENSIVE METABOLIC PANEL - Abnormal; Notable for the following:    Potassium 3.2 (*)    All other components within normal limits  URINALYSIS, ROUTINE W REFLEX MICROSCOPIC - Abnormal; Notable for the following:    Color, Urine AMBER (*)    APPearance CLOUDY (*)    Specific Gravity, Urine 1.034 (*)    Bilirubin Urine SMALL (*)    Ketones, ur 15 (*)    Leukocytes, UA SMALL (*)    All other components within normal limits  URINE MICROSCOPIC-ADD ON - Abnormal; Notable for the following:    Squamous Epithelial / LPF MANY (*)    Bacteria, UA FEW (*)    Crystals CA OXALATE CRYSTALS (*)    All other components within normal limits  CBC WITH DIFFERENTIAL   Imaging Review No results found.  EKG Interpretation   None       MDM   Final  diagnoses:  Swelling of upper lip  Blister of lip with infection    33 year old female presents for worsening right upper lip swelling. Patient states that symptoms began as a small bump consistent with a cold sore and have worsened over the past 5 days. Patient has been on a course of acyclovir your without improvement. She endorses a history of MRSA.  Patient well and nontoxic appearing, hemodynamically stable, and afebrile. She is mildly tachycardic, but states she is very nervous. Oropharynx clear today and patient tolerating secretions without difficulty or drooling. She speaks in full sentences. No nuchal rigidity or meningismus on exam. Patient has no leukocytosis today to suggest rapid spread of infection.  History of MRSA, patient treated with clindamycin and ED. Patient given prescription for Bactrim by the physician at her Suboxone clinic. Will add Keflex to cover for secondary infection and refer to ENT. Patient in no pain and has been managing her pain with ibuprofen. Have advised continued use of same as outpatient. Patient stable and appropriate for discharge today with necessary return precautions. Patient agreeable to plan with no unaddressed concerns.    Antonietta Breach, PA-C 09/25/13 2004

## 2013-09-25 NOTE — ED Notes (Addendum)
Pt has abscess to right side of lips. Pt seen at [facility] Saturday morning for small area on lip, told it was a cold sore. Now appears very red/swollen. Seen at [facility] clinic and told to come here for IV antibiotics, also only certain medications pt can receive for pain due to being in suboxone clinic. Pt has been taking antibiotics without improvement. No nausea/vomiting. Eating and drinking some.

## 2013-09-25 NOTE — ED Provider Notes (Signed)
Medical screening examination/treatment/procedure(s) were conducted as a shared visit with non-physician practitioner(s) and myself.  I personally evaluated the patient during the encounter.  EKG Interpretation   None      Results for orders placed during the hospital encounter of 09/25/13  CBC WITH DIFFERENTIAL      Result Value Ref Range   WBC 9.2  4.0 - 10.5 K/uL   RBC 4.58  3.87 - 5.11 MIL/uL   Hemoglobin 13.9  12.0 - 15.0 g/dL   HCT 40.0  36.0 - 46.0 %   MCV 87.3  78.0 - 100.0 fL   MCH 30.3  26.0 - 34.0 pg   MCHC 34.8  30.0 - 36.0 g/dL   RDW 12.6  11.5 - 15.5 %   Platelets 247  150 - 400 K/uL   Neutrophils Relative % 71  43 - 77 %   Neutro Abs 6.5  1.7 - 7.7 K/uL   Lymphocytes Relative 22  12 - 46 %   Lymphs Abs 2.0  0.7 - 4.0 K/uL   Monocytes Relative 6  3 - 12 %   Monocytes Absolute 0.6  0.1 - 1.0 K/uL   Eosinophils Relative 1  0 - 5 %   Eosinophils Absolute 0.1  0.0 - 0.7 K/uL   Basophils Relative 0  0 - 1 %   Basophils Absolute 0.0  0.0 - 0.1 K/uL  COMPREHENSIVE METABOLIC PANEL      Result Value Ref Range   Sodium 139  137 - 147 mEq/L   Potassium 3.2 (*) 3.7 - 5.3 mEq/L   Chloride 99  96 - 112 mEq/L   CO2 25  19 - 32 mEq/L   Glucose, Bld 98  70 - 99 mg/dL   BUN 15  6 - 23 mg/dL   Creatinine, Ser 0.57  0.50 - 1.10 mg/dL   Calcium 9.6  8.4 - 10.5 mg/dL   Total Protein 8.0  6.0 - 8.3 g/dL   Albumin 3.9  3.5 - 5.2 g/dL   AST 17  0 - 37 U/L   ALT 12  0 - 35 U/L   Alkaline Phosphatase 70  39 - 117 U/L   Total Bilirubin 0.4  0.3 - 1.2 mg/dL   GFR calc non Af Amer >90  >90 mL/min   GFR calc Af Amer >90  >90 mL/min  URINALYSIS, ROUTINE W REFLEX MICROSCOPIC      Result Value Ref Range   Color, Urine AMBER (*) YELLOW   APPearance CLOUDY (*) CLEAR   Specific Gravity, Urine 1.034 (*) 1.005 - 1.030   pH 5.5  5.0 - 8.0   Glucose, UA NEGATIVE  NEGATIVE mg/dL   Hgb urine dipstick NEGATIVE  NEGATIVE   Bilirubin Urine SMALL (*) NEGATIVE   Ketones, ur 15 (*) NEGATIVE  mg/dL   Protein, ur NEGATIVE  NEGATIVE mg/dL   Urobilinogen, UA 1.0  0.0 - 1.0 mg/dL   Nitrite NEGATIVE  NEGATIVE   Leukocytes, UA SMALL (*) NEGATIVE  URINE MICROSCOPIC-ADD ON      Result Value Ref Range   Squamous Epithelial / LPF MANY (*) RARE   WBC, UA 3-6  <3 WBC/hpf   Bacteria, UA FEW (*) RARE   Crystals CA OXALATE CRYSTALS (*) NEGATIVE   Urine-Other MUCOUS PRESENT     The patient seen by me. Patient's symptoms seem to be consistent with a herpetic lesion cold sore on the lip. Perhaps is gotten secondarily infected. Patient being treated with acyclovir air now with  a bit of a necrotic area on the right upper lip and swelling no tongue swelling. We'll add antibiotics and to the treatment regimen have her followup with her regular Dr.  Mervin Kung, MD 09/25/13 [phone]

## 2013-09-25 NOTE — Discharge Instructions (Signed)
Recommend that you finish your course of acyclovir. Also recommend that you begin taking Bactrim and Keflex as prescribed. Followup with an ENT doctor regarding your symptoms as well as your primary care physician. Return to emergency department if symptoms worsen.  Cellulitis Cellulitis is an infection of the skin and the tissue beneath it. The infected area is usually red and tender. Cellulitis occurs most often in the arms and lower legs.  CAUSES  Cellulitis is caused by bacteria that enter the skin through cracks or cuts in the skin. The most common types of bacteria that cause cellulitis are Staphylococcus and Streptococcus. SYMPTOMS   Redness and warmth.  Swelling.  Tenderness or pain.  Fever. DIAGNOSIS  Your caregiver can usually determine what is wrong based on a physical exam. Blood tests may also be done. TREATMENT  Treatment usually involves taking an antibiotic medicine. HOME CARE INSTRUCTIONS   Take your antibiotics as directed. Finish them even if you start to feel better.  Keep the infected arm or leg elevated to reduce swelling.  Apply a warm cloth to the affected area up to 4 times per day to relieve pain.  Only take over-the-counter or prescription medicines for pain, discomfort, or fever as directed by your caregiver.  Keep all follow-up appointments as directed by your caregiver. SEEK MEDICAL CARE IF:   You notice red streaks coming from the infected area.  Your red area gets larger or turns dark in color.  Your bone or joint underneath the infected area becomes painful after the skin has healed.  Your infection returns in the same area or another area.  You notice a swollen bump in the infected area.  You develop new symptoms. SEEK IMMEDIATE MEDICAL CARE IF:   You have a fever.  You feel very sleepy.  You develop vomiting or diarrhea.  You have a general ill feeling (malaise) with muscle aches and pains. MAKE SURE YOU:   Understand these  instructions.  Will watch your condition.  Will get help right away if you are not doing well or get worse. Document Released: 05/04/2005 Document Revised: 01/24/2012 Document Reviewed: 10/10/2011 [facility] Patient Information 2014 [employer].

## 2014-06-09 ENCOUNTER — Encounter (HOSPITAL_COMMUNITY): Payer: Self-pay | Admitting: Emergency Medicine

## 2016-07-04 ENCOUNTER — Encounter (HOSPITAL_COMMUNITY): Payer: Self-pay

## 2016-07-04 DIAGNOSIS — F17210 Nicotine dependence, cigarettes, uncomplicated: Secondary | ICD-10-CM | POA: Diagnosis not present

## 2016-07-04 DIAGNOSIS — Z79899 Other long term (current) drug therapy: Secondary | ICD-10-CM | POA: Insufficient documentation

## 2016-07-04 DIAGNOSIS — K0889 Other specified disorders of teeth and supporting structures: Secondary | ICD-10-CM | POA: Diagnosis present

## 2016-07-04 DIAGNOSIS — K029 Dental caries, unspecified: Secondary | ICD-10-CM | POA: Insufficient documentation

## 2016-07-04 NOTE — ED Triage Notes (Signed)
Pt complaining of tooth ache x 1 week. Pt complaining of bottom R side pain and swelling. Pt states tooth broke a while ago, increased pain x 1 week.

## 2016-07-05 ENCOUNTER — Emergency Department (HOSPITAL_COMMUNITY)
Admission: EM | Admit: 2016-07-05 | Discharge: 2016-07-05 | Disposition: A | Discharge status: Home / Self Care | Payer: Medicaid Other | Attending: Emergency Medicine | Admitting: Emergency Medicine

## 2016-07-05 DIAGNOSIS — K0889 Other specified disorders of teeth and supporting structures: Secondary | ICD-10-CM

## 2016-07-05 MED ORDER — HYDROCODONE-ACETAMINOPHEN 5-325 MG PO TABS
1.0000 | ORAL_TABLET | Freq: Once | ORAL | Status: AC
  Administered 2016-07-05: 1 via ORAL
  Filled 2016-07-05: qty 1

## 2016-07-05 MED ORDER — TRAMADOL HCL 50 MG PO TABS: 50.0000 mg | ORAL_TABLET | Freq: Once | ORAL | Status: DC

## 2016-07-05 MED ORDER — PENICILLIN V POTASSIUM 500 MG PO TABS: 500.0000 mg | ORAL_TABLET | Freq: Four times a day (QID) | ORAL | 0 refills | Status: AC

## 2016-07-05 MED ORDER — PENICILLIN V POTASSIUM 250 MG PO TABS
500.0000 mg | ORAL_TABLET | Freq: Once | ORAL | Status: AC
  Administered 2016-07-05: 500 mg via ORAL
  Filled 2016-07-05: qty 2

## 2016-07-05 MED ORDER — NAPROXEN 500 MG PO TABS: 500.0000 mg | ORAL_TABLET | Freq: Two times a day (BID) | ORAL | 0 refills | Status: AC

## 2016-07-05 NOTE — ED Provider Notes (Signed)
[facility] DEPT Provider Note   CSN: [id number] Arrival date & time: 07/04/16  2338     History   Chief Complaint Chief Complaint  Patient presents with  . Dental Pain    HPI Jean [name] is a 35 y.o. female.  Jean [name] is a 35 y.o. female with h/o depression, anxiety, drug abuse, and seizure presents to ED with complaint of right lower dental pain. Patient reports onset of right lower dental pain approximately one week ago. Pain is sharp in nature, constant, and exacerbated by eating/chewing. She reports associated right lower facial swelling. She has tried taking ibuprofen with minimal relief. She denies trauma, fever, trouble swallowing, foul tasting/purulent discharge, abdominal pain, N/V, neck pain, or myalgias. She does not have a Pharmacist, community.       Past Medical History:  Diagnosis Date  . Abnormal Pap smear 2008   COLPO DONE; WAS NORMAL;LAST PAP 06/2010 WAS NORMAL  . Anxiety    TAKES ZOLOFT  . Chronic cholecystitis 11/2010  . CIN I (cervical intraepithelial neoplasia I)   . CIN II (cervical intraepithelial neoplasia II)   . Depression    TAKES ZOLOFT  . Drug abuse    WAS ON METHADONE IN PAST;SUBOXONE  CURRENTLY  . FHx: cancer   . FHx: diabetes mellitus   . FHx: hypertension   . H/O chlamydia infection 2005   WAS TREATED  . H/O varicella   . Hx: UTI (urinary tract infection)    NOT FREQ  . Pregnancy induced hypertension 2005   WAS GIVEN BP MEDS  WHILE IN HOSPITAL AND PP  . Seizure ([place])    ONLY ONLY ONE;D/T medication reaction  . Suboxone maintenance treatment complicating pregnancy, antepartum ([place])   . UTI (lower urinary tract infection)   . Yeast infection    NOT FREQ    Patient Active Problem List   Diagnosis Date Noted  . Umbilical abnormality [id number]  . Fetal abnormality in pregnancy 08/03/2012  . Papanicolaou smear for cervical cancer screening 02/22/2012  . H/O cesarean section 02/19/2012  . Drug abuse and dependence ([place])  02/19/2012  . Anxiety 02/19/2012  . Depression 02/19/2012  . Hx LEEP (loop electrosurgical excision procedure), cervix, pregnancy 02/19/2012  . Preterm delivery 02/19/2012    Past Surgical History:  Procedure Laterality Date  . CESAREAN SECTION  2005 & 2008  . CESAREAN SECTION WITH BILATERAL TUBAL LIGATION  08/29/2012   Procedure: CESAREAN SECTION WITH BILATERAL TUBAL LIGATION;  Surgeon: Delice Lesch, MD;  Location: [facility];  Service: Obstetrics;  Laterality: Bilateral;  Repeat  . DILATION AND CURETTAGE OF UTERUS    . LAPAROSCOPIC CHOLECYSTECTOMY W/ CHOLANGIOGRAPHY  11/09/2010  . LEEP  06/29/2007  . WISDOM TOOTH EXTRACTION     ALL 4 EXTRACTED    OB History    Gravida Para Term Preterm AB Living   5 4 3 1 1 4    SAB TAB Ectopic Multiple Live Births   0 1     4       Home Medications    Prior to Admission medications   Medication Sig Start Date End Date Taking? Authorizing Provider  acetaminophen (TYLENOL) 500 MG tablet Take 500 mg by mouth every 6 (six) hours as needed.     Historical Provider, MD  acyclovir (ZOVIRAX) 800 MG tablet Take 800 mg by mouth 4 (four) times daily.    Historical Provider, MD  benzocaine (ORAJEL) 10 % mucosal gel Use as directed 1 application in the mouth or  throat as needed for mouth pain.    Historical Provider, MD  Buprenorphine HCl-Naloxone HCl (SUBOXONE) 8-2 MG FILM Place 0.25 each under the tongue 5 (five) times daily.    Historical Provider, MD  Camphor-Phenol (CAMPHO-PHENIQUE) 10.8-4.7 % GEL Apply 1 application topically daily as needed (for irritation).    Historical Provider, MD  cephALEXin (KEFLEX) 500 MG capsule Take 1 capsule (500 mg total) by mouth 4 (four) times daily. 09/25/13   Antonietta Breach, PA-C  Cold Sore Products (BLISTEX EX) Apply 1 application topically daily as needed (for dry lips).    Historical Provider, MD  Docosanol (ABREVA) 10 % CREA Apply 1 application topically daily as needed (for cold sores).    Historical Provider, MD    DULoxetine (CYMBALTA) 20 MG capsule Take 20 mg by mouth daily.    Historical Provider, MD  ibuprofen (ADVIL,MOTRIN) 200 MG tablet Take 400 mg by mouth every 6 (six) hours as needed for moderate pain.    Historical Provider, MD  lisdexamfetamine (VYVANSE) 50 MG capsule Take 50 mg by mouth daily.    Historical Provider, MD  mupirocin cream (BACTROBAN) 2 % Apply 1 application topically once.    Historical Provider, MD  naproxen (NAPROSYN) 500 MG tablet Take 1 tablet (500 mg total) by mouth 2 (two) times daily. 07/05/16 07/12/16  Roxanna Mew, PA-C  penicillin v potassium (VEETID) 500 MG tablet Take 1 tablet (500 mg total) by mouth 4 (four) times daily. 07/05/16 07/12/16  Roxanna Mew, PA-C  promethazine (PHENERGAN) 25 MG tablet Take 25 mg by mouth every 6 (six) hours as needed for nausea or vomiting.    Historical Provider, MD    Family History Family History  Problem Relation Age of Onset  . Hypertension Mother   . Cancer Mother     STAGE IV;RECENT DX  . Hyperlipidemia Mother   . Depression Mother     ON MEDS  . Depression Father     ON MEDS  . Diabetes Maternal Grandmother     IDDM  . Other Maternal Grandmother     VARICOSE VEINS    Social History Social History  Substance Use Topics  . Smoking status: Current Every Day Smoker    Packs/day: 0.50    Types: Cigarettes  . Smokeless tobacco: Never Used     Comment: not ready to quit.  MFM consult feels attempts will exacerbate narcotic substiitiution therapy  . Alcohol use No     Allergies   Patient has no known allergies.   Review of Systems Review of Systems  Constitutional: Negative for fever.  HENT: Positive for dental problem and facial swelling. Negative for trouble swallowing.   Respiratory: Negative for shortness of breath.   Gastrointestinal: Negative for nausea and vomiting.  Musculoskeletal: Negative for myalgias and neck pain.  Skin: Negative for rash.     Physical Exam Updated Vital  Signs BP 134/92 (BP Location: Left Arm)   Pulse 94   Temp 97.9 F (36.6 C) (Oral)   Resp 20   Ht 5\' 9"  (1.753 m)   Wt 55.5 kg   LMP 06/19/2016   SpO2 100%   BMI 18.06 kg/m   Physical Exam  Constitutional: She appears well-developed and well-nourished. No distress.  HENT:  Head: Atraumatic.  Mouth/Throat: Uvula is midline, oropharynx is clear and moist and mucous membranes are normal. No trismus in the jaw. Abnormal dentition. Dental caries present. No uvula swelling. No tonsillar exudate.  Right sided lower facial swelling. Partial edentulous  with significant dental caries. TTP of #29 with associated gingival swelling. No obvious abscess. No trismus. Uvula midline. No tongue swelling. Managing oral secretions.     Eyes: Conjunctivae and EOM are normal. Pupils are equal, round, and reactive to light. No scleral icterus.  Neck: Normal range of motion and phonation normal. Neck supple. No neck rigidity. Normal range of motion present.  No nuchal rigidity.   Cardiovascular: Normal rate and intact distal pulses.   Pulmonary/Chest: Effort normal. No stridor. No respiratory distress.  Abdominal: She exhibits no distension.  Lymphadenopathy:    She has no cervical adenopathy.  Neurological: She is alert. She is not disoriented. Coordination and gait normal. GCS eye subscore is 4. GCS verbal subscore is 5. GCS motor subscore is 6.  CN 2-12 grossly intact.   Skin: Skin is warm and dry. She is not diaphoretic.  Psychiatric: She has a normal mood and affect. Her behavior is normal.     ED Treatments / Results  Labs (all labs ordered are listed, but only abnormal results are displayed) Labs Reviewed - No data to display  EKG  EKG Interpretation None       Radiology No results found.  Procedures Procedures (including critical care time)  Medications Ordered in ED Medications  penicillin v potassium (VEETID) tablet 500 mg (500 mg Oral Given 07/05/16 0129)   HYDROcodone-acetaminophen (NORCO/VICODIN) 5-325 MG per tablet 1 tablet (1 tablet Oral Given 07/05/16 0129)     Initial Impression / Assessment and Plan / ED Course  I have reviewed the triage vital signs and the nursing notes.  Pertinent labs & imaging results that were available during my care of the patient were reviewed by me and considered in my medical decision making (see chart for details).  Clinical Course     Patient presents to ED with complaint of dental pain x 1 week with associated right lower facial swelling. Patient is afebrile and non-toxic appearing in NAD. VSS.  Mild right sided lower facial swelling. Partial edentulous and multiple caries noted. TTP #29. No trismus. Uvula midline. Managing oral secretions. No nuchal rigidity.  No gross abscess.  Exam unconcerning for Ludwig's angina or spread of infection at this time. Will treat with penicillin and pain medicine.  Urged patient to follow-up with dentist, contact information provided. Return precautions given. Pt voiced understanding and is agreeable.    Final Clinical Impressions(s) / ED Diagnoses   Final diagnoses:  Pain, dental    New Prescriptions Discharge Medication List as of 07/05/2016  1:18 AM    START taking these medications   Details  naproxen (NAPROSYN) 500 MG tablet Take 1 tablet (500 mg total) by mouth 2 (two) times daily., Starting Tue 07/05/2016, Until Tue 07/12/2016, Print    penicillin v potassium (VEETID) 500 MG tablet Take 1 tablet (500 mg total) by mouth 4 (four) times daily., Starting Tue 07/05/2016, Until Tue 07/12/2016, Print         Roxanna Mew, PA-C 07/05/16 [place], MD 07/05/16 [phone]

## 2016-07-05 NOTE — Discharge Instructions (Signed)
Read the information below.  You are being prescribed antibiotics. Please take as directed.  I have prescribed naprosyn for pain relief. While taking do not take any other NSAIDs (ibuprofen, motrin, aleve).  It is very important that you follow up with a dentist, I have provided th contact information for them. Please call in the AM and let them know you were seen in the ED and referred.  Use the prescribed medication as directed.  Please discuss all new medications with your pharmacist.   You may return to the Emergency Department at any time for worsening condition or any new symptoms that concern you. Return if you develop tongue swelling, inability to open mouth, trouble swallowing, trouble breathing.

## 2017-01-10 ENCOUNTER — Emergency Department (HOSPITAL_COMMUNITY)
Admission: EM | Admit: 2017-01-10 | Discharge: 2017-01-10 | Disposition: A | Discharge status: Home / Self Care | Payer: Medicaid Other | Attending: Emergency Medicine | Admitting: Emergency Medicine

## 2017-01-10 ENCOUNTER — Emergency Department (HOSPITAL_COMMUNITY): Payer: Medicaid Other

## 2017-01-10 ENCOUNTER — Encounter (HOSPITAL_COMMUNITY): Payer: Self-pay | Admitting: Emergency Medicine

## 2017-01-10 DIAGNOSIS — Y92410 Unspecified street and highway as the place of occurrence of the external cause: Secondary | ICD-10-CM | POA: Diagnosis not present

## 2017-01-10 DIAGNOSIS — Y999 Unspecified external cause status: Secondary | ICD-10-CM | POA: Insufficient documentation

## 2017-01-10 DIAGNOSIS — F17210 Nicotine dependence, cigarettes, uncomplicated: Secondary | ICD-10-CM | POA: Insufficient documentation

## 2017-01-10 DIAGNOSIS — S6982XA Other specified injuries of left wrist, hand and finger(s), initial encounter: Secondary | ICD-10-CM | POA: Insufficient documentation

## 2017-01-10 DIAGNOSIS — Z23 Encounter for immunization: Secondary | ICD-10-CM | POA: Diagnosis not present

## 2017-01-10 DIAGNOSIS — S6992XA Unspecified injury of left wrist, hand and finger(s), initial encounter: Secondary | ICD-10-CM

## 2017-01-10 DIAGNOSIS — M79645 Pain in left finger(s): Secondary | ICD-10-CM

## 2017-01-10 DIAGNOSIS — Y9389 Activity, other specified: Secondary | ICD-10-CM | POA: Insufficient documentation

## 2017-01-10 MED ORDER — TETANUS-DIPHTH-ACELL PERTUSSIS 5-2.5-18.5 LF-MCG/0.5 IM SUSP
0.5000 mL | Freq: Once | INTRAMUSCULAR | Status: AC
  Administered 2017-01-10: 0.5 mL via INTRAMUSCULAR
  Filled 2017-01-10: qty 0.5

## 2017-01-10 MED ORDER — HYDROMORPHONE HCL 1 MG/ML IJ SOLN
1.0000 mg | Freq: Once | INTRAMUSCULAR | Status: AC
  Administered 2017-01-10: 1 mg via INTRAVENOUS
  Filled 2017-01-10: qty 1

## 2017-01-10 MED ORDER — HYDROMORPHONE HCL 1 MG/ML IJ SOLN: 1.0000 mg | Freq: Once | INTRAMUSCULAR | Status: DC
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
## 2017-01-10 NOTE — ED Notes (Signed)
Pt is in stable condition upon d/c and ambulates from ED. 

## 2017-01-10 NOTE — ED Notes (Signed)
TRAUMA START: 2224
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
## 2017-01-10 NOTE — Progress Notes (Signed)
Orthopedic Tech Progress Note Patient Details:  Jean [name] [birth year] [id number]  Ortho Devices Type of Ortho Device: Thumb velcro splint Ortho Device/Splint Location: LUE Ortho Device/Splint Interventions: Ordered, Application   Braulio Bosch 01/10/2017, 8:42 PM

## 2017-01-10 NOTE — Discharge Instructions (Signed)
Please take ibuprofen/tylenol as needed for pain. Wear thumb spica at all times as possible. Please followup with Hand Service for re-evaluation of L thumb.

## 2017-01-10 NOTE — ED Notes (Signed)
Patient transported to X-ray 

## 2017-01-11 ENCOUNTER — Encounter (HOSPITAL_COMMUNITY): Payer: Self-pay

## 2018-10-03 ENCOUNTER — Inpatient Hospital Stay (HOSPITAL_COMMUNITY)
Admission: EM | Admit: 2018-10-03 | Discharge: 2018-10-07 | DRG: 378 | Disposition: A | Discharge status: Home / Self Care | Payer: Medicaid Other | Attending: Family Medicine | Admitting: Family Medicine

## 2018-10-03 ENCOUNTER — Encounter (HOSPITAL_COMMUNITY): Payer: Self-pay | Admitting: Emergency Medicine

## 2018-10-03 ENCOUNTER — Other Ambulatory Visit: Payer: Self-pay

## 2018-10-03 DIAGNOSIS — K029 Dental caries, unspecified: Secondary | ICD-10-CM | POA: Diagnosis present

## 2018-10-03 DIAGNOSIS — K922 Gastrointestinal hemorrhage, unspecified: Secondary | ICD-10-CM | POA: Diagnosis not present

## 2018-10-03 DIAGNOSIS — K0859 Other unsatisfactory restoration of tooth: Secondary | ICD-10-CM | POA: Diagnosis present

## 2018-10-03 DIAGNOSIS — F1020 Alcohol dependence, uncomplicated: Secondary | ICD-10-CM | POA: Diagnosis not present

## 2018-10-03 DIAGNOSIS — Z792 Long term (current) use of antibiotics: Secondary | ICD-10-CM

## 2018-10-03 DIAGNOSIS — Z833 Family history of diabetes mellitus: Secondary | ICD-10-CM

## 2018-10-03 DIAGNOSIS — F17210 Nicotine dependence, cigarettes, uncomplicated: Secondary | ICD-10-CM | POA: Diagnosis present

## 2018-10-03 DIAGNOSIS — K011 Impacted teeth: Secondary | ICD-10-CM | POA: Diagnosis present

## 2018-10-03 DIAGNOSIS — R10816 Epigastric abdominal tenderness: Secondary | ICD-10-CM | POA: Diagnosis present

## 2018-10-03 DIAGNOSIS — K045 Chronic apical periodontitis: Secondary | ICD-10-CM | POA: Diagnosis present

## 2018-10-03 DIAGNOSIS — E876 Hypokalemia: Secondary | ICD-10-CM | POA: Diagnosis present

## 2018-10-03 DIAGNOSIS — Z800 Family history of malignant neoplasm of digestive organs: Secondary | ICD-10-CM

## 2018-10-03 DIAGNOSIS — K0889 Other specified disorders of teeth and supporting structures: Secondary | ICD-10-CM | POA: Diagnosis present

## 2018-10-03 DIAGNOSIS — F408 Other phobic anxiety disorders: Secondary | ICD-10-CM | POA: Diagnosis present

## 2018-10-03 DIAGNOSIS — E440 Moderate protein-calorie malnutrition: Secondary | ICD-10-CM | POA: Diagnosis present

## 2018-10-03 DIAGNOSIS — K06020 Generalized gingival recession, unspecified: Secondary | ICD-10-CM | POA: Diagnosis present

## 2018-10-03 DIAGNOSIS — M264 Malocclusion, unspecified: Secondary | ICD-10-CM | POA: Diagnosis present

## 2018-10-03 DIAGNOSIS — F1920 Other psychoactive substance dependence, uncomplicated: Secondary | ICD-10-CM | POA: Diagnosis present

## 2018-10-03 DIAGNOSIS — Z681 Body mass index (BMI) 19 or less, adult: Secondary | ICD-10-CM

## 2018-10-03 DIAGNOSIS — Z791 Long term (current) use of non-steroidal anti-inflammatories (NSAID): Secondary | ICD-10-CM

## 2018-10-03 DIAGNOSIS — K08109 Complete loss of teeth, unspecified cause, unspecified class: Secondary | ICD-10-CM | POA: Diagnosis present

## 2018-10-03 DIAGNOSIS — K047 Periapical abscess without sinus: Secondary | ICD-10-CM | POA: Diagnosis not present

## 2018-10-03 DIAGNOSIS — K048 Radicular cyst: Secondary | ICD-10-CM | POA: Diagnosis present

## 2018-10-03 DIAGNOSIS — K036 Deposits [accretions] on teeth: Secondary | ICD-10-CM | POA: Diagnosis present

## 2018-10-03 DIAGNOSIS — Z8349 Family history of other endocrine, nutritional and metabolic diseases: Secondary | ICD-10-CM

## 2018-10-03 DIAGNOSIS — F419 Anxiety disorder, unspecified: Secondary | ICD-10-CM | POA: Diagnosis present

## 2018-10-03 DIAGNOSIS — Z79899 Other long term (current) drug therapy: Secondary | ICD-10-CM

## 2018-10-03 DIAGNOSIS — K920 Hematemesis: Secondary | ICD-10-CM | POA: Diagnosis not present

## 2018-10-03 DIAGNOSIS — F319 Bipolar disorder, unspecified: Secondary | ICD-10-CM | POA: Diagnosis present

## 2018-10-03 DIAGNOSIS — M8588 Other specified disorders of bone density and structure, other site: Secondary | ICD-10-CM | POA: Diagnosis present

## 2018-10-03 DIAGNOSIS — Z8249 Family history of ischemic heart disease and other diseases of the circulatory system: Secondary | ICD-10-CM

## 2018-10-03 DIAGNOSIS — R1013 Epigastric pain: Secondary | ICD-10-CM | POA: Diagnosis present

## 2018-10-03 DIAGNOSIS — D649 Anemia, unspecified: Secondary | ICD-10-CM | POA: Diagnosis not present

## 2018-10-03 DIAGNOSIS — R195 Other fecal abnormalities: Secondary | ICD-10-CM | POA: Diagnosis present

## 2018-10-03 DIAGNOSIS — F1990 Other psychoactive substance use, unspecified, uncomplicated: Secondary | ICD-10-CM | POA: Diagnosis not present

## 2018-10-03 LAB — CBC WITH DIFFERENTIAL/PLATELET
Abs Immature Granulocytes: 0.02 10*3/uL (ref 0.00–0.07)
BASOS ABS: 0 10*3/uL (ref 0.0–0.1)
Basophils Relative: 0 %
EOS ABS: 0.2 10*3/uL (ref 0.0–0.5)
EOS PCT: 2 %
HCT: 44.1 % (ref 36.0–46.0)
Hemoglobin: 14.5 g/dL (ref 12.0–15.0)
Immature Granulocytes: 0 %
LYMPHS ABS: 2 10*3/uL (ref 0.7–4.0)
Lymphocytes Relative: 20 %
MCH: 29.4 pg (ref 26.0–34.0)
MCHC: 32.9 g/dL (ref 30.0–36.0)
MCV: 89.3 fL (ref 80.0–100.0)
Monocytes Absolute: 0.7 10*3/uL (ref 0.1–1.0)
Monocytes Relative: 7 %
NRBC: 0 % (ref 0.0–0.2)
Neutro Abs: 7.3 10*3/uL (ref 1.7–7.7)
Neutrophils Relative %: 71 %
PLATELETS: 204 10*3/uL (ref 150–400)
RBC: 4.94 MIL/uL (ref 3.87–5.11)
RDW: 12.9 % (ref 11.5–15.5)
WBC: 10.1 10*3/uL (ref 4.0–10.5)

## 2018-10-03 LAB — SALICYLATE LEVEL: SALICYLATE LVL: 32.3 mg/dL — AB (ref 2.8–30.0)

## 2018-10-03 LAB — URINALYSIS, ROUTINE W REFLEX MICROSCOPIC
Bilirubin Urine: NEGATIVE
GLUCOSE, UA: NEGATIVE mg/dL
Ketones, ur: 20 mg/dL — AB
Leukocytes,Ua: NEGATIVE
NITRITE: NEGATIVE
PH: 5 (ref 5.0–8.0)
Protein, ur: NEGATIVE mg/dL
SPECIFIC GRAVITY, URINE: 1.025 (ref 1.005–1.030)

## 2018-10-03 LAB — CBC
HCT: 38.7 % (ref 36.0–46.0)
Hemoglobin: 12.8 g/dL (ref 12.0–15.0)
MCH: 29.6 pg (ref 26.0–34.0)
MCHC: 33.1 g/dL (ref 30.0–36.0)
MCV: 89.6 fL (ref 80.0–100.0)
Platelets: 194 10*3/uL (ref 150–400)
RBC: 4.32 MIL/uL (ref 3.87–5.11)
RDW: 13 % (ref 11.5–15.5)
WBC: 9.5 10*3/uL (ref 4.0–10.5)
nRBC: 0 % (ref 0.0–0.2)

## 2018-10-03 LAB — COMPREHENSIVE METABOLIC PANEL
ALK PHOS: 56 U/L (ref 38–126)
ALT: 14 U/L (ref 0–44)
ANION GAP: 11 (ref 5–15)
AST: 20 U/L (ref 15–41)
Albumin: 3.9 g/dL (ref 3.5–5.0)
BUN: 8 mg/dL (ref 6–20)
CALCIUM: 9.3 mg/dL (ref 8.9–10.3)
CO2: 24 mmol/L (ref 22–32)
Chloride: 104 mmol/L (ref 98–111)
Creatinine, Ser: 0.72 mg/dL (ref 0.44–1.00)
GFR calc non Af Amer: >60 mL/min (ref >60)
Glucose, Bld: 123 mg/dL — ABNORMAL HIGH (ref 70–99)
Potassium: 3.4 mmol/L — ABNORMAL LOW (ref 3.5–5.1)
Sodium: 139 mmol/L (ref 135–145)
Total Bilirubin: 0.4 mg/dL (ref 0.3–1.2)
Total Protein: 7.7 g/dL (ref 6.5–8.1)

## 2018-10-03 LAB — I-STAT BETA HCG BLOOD, ED (MC, WL, AP ONLY): I-stat hCG, quantitative: <5 m[IU]/mL (ref <5)

## 2018-10-03 LAB — LIPASE, BLOOD: LIPASE: 18 U/L (ref 11–51)

## 2018-10-03 LAB — PROTIME-INR
INR: 1.1 (ref 0.8–1.2)
Prothrombin Time: 14 seconds (ref 11.4–15.2)

## 2018-10-03 LAB — POC OCCULT BLOOD, ED: FECAL OCCULT BLD: POSITIVE — AB

## 2018-10-03 MED ORDER — SODIUM CHLORIDE 0.9 % IV BOLUS
1000.0000 mL | Freq: Once | INTRAVENOUS | Status: AC
  Administered 2018-10-03: 1000 mL via INTRAVENOUS

## 2018-10-03 MED ORDER — SODIUM CHLORIDE 0.9 % IV SOLN
80.0000 mg | Freq: Once | INTRAVENOUS | Status: AC
  Administered 2018-10-03: 80 mg via INTRAVENOUS
  Filled 2018-10-03: qty 80

## 2018-10-03 MED ORDER — ONDANSETRON HCL 4 MG/2ML IJ SOLN: 4.0000 mg | Freq: Four times a day (QID) | INTRAMUSCULAR | Status: DC | PRN

## 2018-10-03 MED ORDER — KCL IN DEXTROSE-NACL 40-5-0.9 MEQ/L-%-% IV SOLN
INTRAVENOUS | Status: DC
  Administered 2018-10-04 – 2018-10-05 (×4): via INTRAVENOUS
  Filled 2018-10-03 (×4): qty 1000

## 2018-10-03 MED ORDER — ONDANSETRON HCL 4 MG/2ML IJ SOLN
4.0000 mg | Freq: Once | INTRAMUSCULAR | Status: AC
  Administered 2018-10-03: 4 mg via INTRAVENOUS
  Filled 2018-10-03: qty 2

## 2018-10-03 MED ORDER — PANTOPRAZOLE SODIUM 40 MG IV SOLR: 40.0000 mg | Freq: Two times a day (BID) | INTRAVENOUS | Status: DC

## 2018-10-03 MED ORDER — ONDANSETRON HCL 4 MG PO TABS: 4.0000 mg | ORAL_TABLET | Freq: Four times a day (QID) | ORAL | Status: DC | PRN

## 2018-10-03 MED ORDER — SODIUM CHLORIDE 0.9 % IV SOLN
8.0000 mg/h | INTRAVENOUS | Status: DC
  Administered 2018-10-03 – 2018-10-05 (×4): 8 mg/h via INTRAVENOUS
  Filled 2018-10-03 (×6): qty 80

## 2018-10-03 MED ORDER — PANTOPRAZOLE SODIUM 40 MG IV SOLR
40.0000 mg | Freq: Once | INTRAVENOUS | Status: AC
  Administered 2018-10-03: 40 mg via INTRAVENOUS
  Filled 2018-10-03: qty 40

## 2018-10-03 NOTE — ED Provider Notes (Signed)
MSE was initiated and I personally evaluated the patient and placed orders (if any) at  6:33 PM on October 03, 2018.  The patient appears stable so that the remainder of the MSE may be completed by another provider.  38 year old female whose initial chief complaint was dental pain.  On my evaluation, patient states she is here because she has had 10-12 episodes of emesis that appears to be like coffee grounds.  Patient states that she has also having abdominal pain.  She reports that she started vomiting this morning.  She states she has not noticed any bright red blood but she states that every time she throws up it looks like coffee grounds.  She reports she has been taking ibuprofen and aspirin daily for pain.  Patient also reports that she smokes and states that she drinks approximate 1-2 beers every day.  Patient that she started developing some abdominal pain also.  Patient states that she started feeling some left-sided dental pain last night.  She has not noted any fevers.  Discussed with charge RN regarding moving patient to main ED for further evaluation.   Volanda Napoleon, PA-C 10/03/18 1930    Lajean Saver, MD 10/04/18 1343

## 2018-10-03 NOTE — ED Triage Notes (Signed)
Pt. Stated, I took about 8 ASA between then and now.

## 2018-10-03 NOTE — ED Triage Notes (Signed)
Pt. Stated, Jean [name] been throwing up and I have a tooth problem on the left side of face started this morning at 530

## 2018-10-03 NOTE — ED Provider Notes (Signed)
[facility] EMERGENCY DEPARTMENT Provider Note   CSN: [id number] Arrival date & time: 10/03/18  1635    History   Chief Complaint Chief Complaint  Patient presents with  . Abdominal Pain  . Coffee ground emesis  . Nausea    HPI Jean [name] is a 38 y.o. female.     The history is provided by the patient. No language interpreter was used.  Abdominal Pain     38 year old female with history of polysubstance abuse, diabetes, hypertension presenting for evaluation of abdominal pain.  Patient report she takes NSAIDs on a regular basis.  She has been having dental pain has been taking an increasing amount of anti-inflammatory medication.  Last night she took 8 aspirin and today she reported having upper abdominal pain with associated nausea, vomiting up coffee-ground emesis.  She report approximately 10-12 episodes of emesis that appears to be coffee-ground since this AM.  She has not noticed any bright red blood.  She also admits to using tobacco and drink approximately 1-2 beers every day. Last drink was last night.  Described pain as sharp, throbbing, epigastric, non radiating and rates pain as 8/10. Endorse L lower dental pain which has been waxing and waning but worsen last night.  No prior hx of GERD or ulcers.  Denies lightheadedness, dizziness, cp, sob, hemoptysis, hematochezia or melena.  abd pain started after vomiting.      Past Medical History:  Diagnosis Date  . Abnormal Pap smear 2008   COLPO DONE; WAS NORMAL;LAST PAP 06/2010 WAS NORMAL  . Anxiety    TAKES ZOLOFT  . Chronic cholecystitis 11/2010  . CIN I (cervical intraepithelial neoplasia I)   . CIN II (cervical intraepithelial neoplasia II)   . Depression    TAKES ZOLOFT  . Drug abuse ([place])    WAS ON METHADONE IN PAST;SUBOXONE  CURRENTLY  . FHx: cancer   . FHx: diabetes mellitus   . FHx: hypertension   . H/O chlamydia infection 2005   WAS TREATED  . H/O varicella   . Hx: UTI (urinary  tract infection)    NOT FREQ  . Pregnancy induced hypertension 2005   WAS GIVEN BP MEDS  WHILE IN HOSPITAL AND PP  . Seizure ([place])    ONLY ONLY ONE;D/T medication reaction  . Suboxone maintenance treatment complicating pregnancy, antepartum ([place])   . UTI (lower urinary tract infection)   . Yeast infection    NOT FREQ    Patient Active Problem List   Diagnosis Date Noted  . Umbilical abnormality 44/31/5400  . Fetal abnormality in pregnancy 08/03/2012  . Papanicolaou smear for cervical cancer screening 02/22/2012  . H/O cesarean section 02/19/2012  . Drug abuse and dependence ([place]) 02/19/2012  . Anxiety 02/19/2012  . Depression 02/19/2012  . Hx LEEP (loop electrosurgical excision procedure), cervix, pregnancy 02/19/2012  . Preterm delivery 02/19/2012    Past Surgical History:  Procedure Laterality Date  . CESAREAN SECTION  2005 & 2008  . CESAREAN SECTION WITH BILATERAL TUBAL LIGATION  08/29/2012   Procedure: CESAREAN SECTION WITH BILATERAL TUBAL LIGATION;  Surgeon: Delice Lesch, MD;  Location: [facility];  Service: Obstetrics;  Laterality: Bilateral;  Repeat  . DILATION AND CURETTAGE OF UTERUS    . LAPAROSCOPIC CHOLECYSTECTOMY W/ CHOLANGIOGRAPHY  11/09/2010  . LEEP  06/29/2007  . WISDOM TOOTH EXTRACTION     ALL 4 EXTRACTED     OB History    Gravida  5   Para  4  Term  3   Preterm  1   AB  1   Living  4     SAB  0   TAB  1   Ectopic  0   Multiple      Live Births  4            Home Medications    Prior to Admission medications   Medication Sig Start Date End Date Taking? Authorizing Provider  acetaminophen (TYLENOL) 500 MG tablet Take 500 mg by mouth every 6 (six) hours as needed.     [provider]  acyclovir (ZOVIRAX) 800 MG tablet Take 800 mg by mouth 4 (four) times daily.    [provider]  benzocaine (ORAJEL) 10 % mucosal gel Use as directed 1 application in the mouth or throat as needed for mouth pain.    [provider]  Buprenorphine HCl-Naloxone HCl (SUBOXONE) 8-2 MG FILM Place 0.25 each under the tongue 5 (five) times daily.    [provider]  Camphor-Phenol (CAMPHO-PHENIQUE) 10.8-4.7 % GEL Apply 1 application topically daily as needed (for irritation).    [provider]  cephALEXin (KEFLEX) 500 MG capsule Take 1 capsule (500 mg total) by mouth 4 (four) times daily. 09/25/13   Antonietta Breach, PA-C  Cold Sore Products (BLISTEX EX) Apply 1 application topically daily as needed (for dry lips).    [provider]  Docosanol (ABREVA) 10 % CREA Apply 1 application topically daily as needed (for cold sores).    [provider]  DULoxetine (CYMBALTA) 20 MG capsule Take 20 mg by mouth daily.    [provider]  ibuprofen (ADVIL,MOTRIN) 200 MG tablet Take 400 mg by mouth every 6 (six) hours as needed for moderate pain.    [provider]  lisdexamfetamine (VYVANSE) 50 MG capsule Take 50 mg by mouth daily.    [provider]  mupirocin cream (BACTROBAN) 2 % Apply 1 application topically once.    [provider]  promethazine (PHENERGAN) 25 MG tablet Take 25 mg by mouth every 6 (six) hours as needed for nausea or vomiting.    [provider]    Family History Family History  Problem Relation Age of Onset  . Hypertension Mother   . Cancer Mother        STAGE IV;RECENT DX  . Hyperlipidemia Mother   . Depression Mother        ON MEDS  . Depression Father        ON MEDS  . Diabetes Maternal Grandmother        IDDM  . Other Maternal Grandmother        VARICOSE VEINS    Social History Social History   Tobacco Use  . Smoking status: Current Every Day Smoker    Packs/day: 1.00    Types: Cigarettes  . Smokeless tobacco: Never Used  . Tobacco comment: not ready to quit.  MFM consult feels attempts will exacerbate narcotic substiitiution therapy  Substance Use Topics  . Alcohol use: Yes    Alcohol/week: 4.0 standard  drinks    Types: 4 Cans of beer per week  . Drug use: Not on file    Comment: suboxone     Allergies   Patient has no known allergies.   Review of Systems Review of Systems  Gastrointestinal: Positive for abdominal pain.  All other systems reviewed and are negative.    Physical Exam Updated Vital Signs BP (!) 122/95 (BP Location:  Left Arm)   Pulse 96   Temp 98.9 F (37.2 C) (Oral)   Resp 17   Ht 5\' 9"  (1.753 m)   Wt 54.4 kg   LMP 09/25/2018   SpO2 100%   BMI 17.72 kg/m   Physical Exam Vitals signs and nursing note reviewed.  Constitutional:      General: She is not in acute distress.    Appearance: She is well-developed.  HENT:     Head: Atraumatic.     Comments: Widespread dental decay with tenderness to left lower jaw.  Surrounding decayed tooth without obvious abscess. Eyes:     Conjunctiva/sclera: Conjunctivae normal.  Neck:     Musculoskeletal: Neck supple.  Cardiovascular:     Rate and Rhythm: Normal rate and regular rhythm.  Pulmonary:     Effort: Pulmonary effort is normal. No respiratory distress.     Breath sounds: Normal breath sounds.  Abdominal:     General: Abdomen is flat.     Tenderness: There is abdominal tenderness in the epigastric area. There is no right CVA tenderness, left CVA tenderness, guarding or rebound. Negative signs include Murphy's sign and McBurney's sign.  Genitourinary:    Comments: Chaperone present during exam.  Normal rectal tone, no thrombosed hemorrhoid, no obvious mass, no stool impaction, normal color stool on glove.  Skin:    Findings: No rash.  Neurological:     Mental Status: She is alert.      ED Treatments / Results  Labs (all labs ordered are listed, but only abnormal results are displayed) Labs Reviewed  COMPREHENSIVE METABOLIC PANEL - Abnormal; Notable for the following components:      Result Value   Potassium 3.4 (*)    Glucose, Bld 123 (*)    All other components within normal limits    URINALYSIS, ROUTINE W REFLEX MICROSCOPIC - Abnormal; Notable for the following components:   APPearance HAZY (*)    Hgb urine dipstick SMALL (*)    Ketones, ur 20 (*)    Bacteria, UA RARE (*)    All other components within normal limits  SALICYLATE LEVEL - Abnormal; Notable for the following components:   Salicylate Lvl 31.4 (*)    All other components within normal limits  COMPREHENSIVE METABOLIC PANEL - Abnormal; Notable for the following components:   Potassium 3.1 (*)    Glucose, Bld 106 (*)    Calcium 8.0 (*)    Total Protein 6.0 (*)    Albumin 2.9 (*)    All other components within normal limits  POC OCCULT BLOOD, ED - Abnormal; Notable for the following components:   Fecal Occult Bld POSITIVE (*)    All other components within normal limits  CBC WITH DIFFERENTIAL/PLATELET  LIPASE, BLOOD  HIV ANTIBODY (ROUTINE TESTING W REFLEX)  CBC  CBC  CBC  PROTIME-INR  CBC  H PYLORI, IGM, IGG, IGA AB  I-STAT BETA HCG BLOOD, ED ([facility], [facility], [facility] ONLY)    EKG None  Radiology Dg Orthopantogram  Result Date: 10/04/2018 CLINICAL DATA:  Vomiting.  Pain and swelling left lower jaw. EXAM: ORTHOPANTOGRAM/PANORAMIC COMPARISON:  CT 12/05/2005. FINDINGS: Mandible and maxilla appear to be intact. No focal lesions identified. Dental caries appears to be present. Paranasal sinuses appear to be clear. If symptoms persist maxillofacial CT suggested for further evaluation IMPRESSION: Dental caries.  No acute bony abnormality. Electronically Signed   By: Marcello Moores  Register   On: 10/04/2018 11:19    Procedures Procedures (including critical care time)  Medications Ordered in ED Medications  dextrose 5 % and 0.9 % NaCl with KCl 40 mEq/L infusion ( Intravenous New Bag/Given 10/04/18 0949)  ondansetron (ZOFRAN) tablet 4 mg (has no administration in time range)    Or  ondansetron (ZOFRAN) injection 4 mg (has no administration in time range)  pantoprazole (PROTONIX) 80 mg in sodium chloride 0.9 % 250 mL  (0.32 mg/mL) infusion (8 mg/hr Intravenous New Bag/Given 10/04/18 1108)  pantoprazole (PROTONIX) injection 40 mg (has no administration in time range)  buprenorphine-naloxone (SUBOXONE) 8-2 mg per SL tablet 1 tablet (1 tablet Sublingual Given 10/04/18 0927)  ondansetron (ZOFRAN) injection 4 mg (4 mg Intravenous Given 10/03/18 1854)  pantoprazole (PROTONIX) injection 40 mg (40 mg Intravenous Given 10/03/18 2019)  sodium chloride 0.9 % bolus 1,000 mL (1,000 mLs Intravenous New Bag/Given 10/03/18 2019)  pantoprazole (PROTONIX) 80 mg in sodium chloride 0.9 % 100 mL IVPB (80 mg Intravenous New Bag/Given 10/03/18 2344)     Initial Impression / Assessment and Plan / ED Course  I have reviewed the triage vital signs and the nursing notes.  Pertinent labs & imaging results that were available during my care of the patient were reviewed by me and considered in my medical decision making (see chart for details).        BP 131/85 (BP Location: Right Arm)   Pulse 67   Temp 98.8 F (37.1 C) (Oral)   Resp 18   Ht 5\' 9"  (1.753 m)   Wt 54 kg   LMP 09/25/2018   SpO2 100%   BMI 17.59 kg/m    Final Clinical Impressions(s) / ED Diagnoses   Final diagnoses:  Upper GI bleed    ED Discharge Orders    None     8:07 PM Pt here with epigastric pain, coffee ground emesis in the setting of NSAIDs overuse.  Took 8 ASA last night.  Mild epigastric pain today, otherwise resting comfortably. She is hemodynamically stable.  Mildly elevated ASA level of 32, but normal bicarb and no respiratory discomfort.  Protonix, Zofran, IVF given.  Will consult GI and medicine for admission. Care discussed with DR. Rogene Houston.   8:50 PM Appreciate consultation from GI specialist Dr. [name] Plan who agrees to be available for consultation tomorrow AM and agrees with medicine admission.  Appreciate consultation with [facility] Dr. Jonelle Sidle who agrees to see and admit pt for further care of her GI bleeding 2/2 NSAIDs use     Domenic Moras, PA-C 10/04/18 1502    Fredia Sorrow, MD 10/04/18 [phone]

## 2018-10-03 NOTE — H&P (Signed)
History and Physical   Jean [name]:[id number] DOB: November 06, 1980 DOA: 10/03/2018  Referring MD/NP/PA: Dr. Rogene Houston  PCP: Leonard Downing, MD   Outpatient Specialists: None  Patient coming from: Home  Chief Complaint: Vomiting blood  HPI: Jean [name] is a 38 y.o. female with medical history significant of hypertension, polysubstance abuse presenting to the ER with coffee-ground emesis.  Patient reports our 10-12 episodes of vomiting since this morning.  Patient has been having some dental pain and has been taking aspirin and ibuprofen.  She reported taking at least 8 aspirins today and ibuprofen.  She has been taking them on a regular basis but much more today.  She had upper abdominal pain followed by the nausea vomiting and the coffee-ground emesis.  Patient takes about 1-2 beers a day and smokes tobacco.  Pain currently is at 6 out of 10.  Radiating to her back.  It comes and goes.  She also has a dental pain in the left lower part.  She denied any history of cirrhosis.  No prior history of GI bleed.  Patient is guaiac positive but hemoglobin is stable.  She is being admitted with a diagnosis of upper GI bleed most likely NSAIDs induced.  ED Course: Temperature is 98.9 blood pressure 134 100 pulse 96 respiratory of 17 oxygen sats 94% room air.  Patient's CBC and chemistry normal except for potassium 3.4.  Hemoglobin stays at 14.5.  Urinalysis is negative fecal occult blood test is positive x2.  Salicylate levels is 95.6.  Tylenol level currently pending.  Patient has been evaluated and started on Protonix.  GI consulted will follow patient in the morning.  Review of Systems: As per HPI otherwise 10 point review of systems negative.    Past Medical History:  Diagnosis Date  . Abnormal Pap smear 2008   COLPO DONE; WAS NORMAL;LAST PAP 06/2010 WAS NORMAL  . Anxiety    TAKES ZOLOFT  . Chronic cholecystitis 11/2010  . CIN I (cervical intraepithelial neoplasia I)   . CIN II  (cervical intraepithelial neoplasia II)   . Depression    TAKES ZOLOFT  . Drug abuse ([place])    WAS ON METHADONE IN PAST;SUBOXONE  CURRENTLY  . FHx: cancer   . FHx: diabetes mellitus   . FHx: hypertension   . H/O chlamydia infection 2005   WAS TREATED  . H/O varicella   . Hx: UTI (urinary tract infection)    NOT FREQ  . Pregnancy induced hypertension 2005   WAS GIVEN BP MEDS  WHILE IN HOSPITAL AND PP  . Seizure ([place])    ONLY ONLY ONE;D/T medication reaction  . Suboxone maintenance treatment complicating pregnancy, antepartum ([place])   . UTI (lower urinary tract infection)   . Yeast infection    NOT FREQ    Past Surgical History:  Procedure Laterality Date  . CESAREAN SECTION  2005 & 2008  . CESAREAN SECTION WITH BILATERAL TUBAL LIGATION  08/29/2012   Procedure: CESAREAN SECTION WITH BILATERAL TUBAL LIGATION;  Surgeon: Delice Lesch, MD;  Location: [facility];  Service: Obstetrics;  Laterality: Bilateral;  Repeat  . DILATION AND CURETTAGE OF UTERUS    . LAPAROSCOPIC CHOLECYSTECTOMY W/ CHOLANGIOGRAPHY  11/09/2010  . LEEP  06/29/2007  . WISDOM TOOTH EXTRACTION     ALL 4 EXTRACTED     reports that she has been smoking cigarettes. She has been smoking about 1.00 pack per day. She has never used smokeless tobacco. She reports current alcohol use of  about 4.0 standard drinks of alcohol per week.  Drug: Other-see comments.  No Known Allergies  Family History  Problem Relation Age of Onset  . Hypertension Mother   . Cancer Mother        STAGE IV;RECENT DX  . Hyperlipidemia Mother   . Depression Mother        ON MEDS  . Depression Father        ON MEDS  . Diabetes Maternal Grandmother        IDDM  . Other Maternal Grandmother        VARICOSE VEINS     Prior to Admission medications   Medication Sig Start Date End Date Taking? Authorizing Provider  acetaminophen (TYLENOL) 500 MG tablet Take 500 mg by mouth every 6 (six) hours as needed.     [provider]  acyclovir  (ZOVIRAX) 800 MG tablet Take 800 mg by mouth 4 (four) times daily.    [provider]  benzocaine (ORAJEL) 10 % mucosal gel Use as directed 1 application in the mouth or throat as needed for mouth pain.    [provider]  Buprenorphine HCl-Naloxone HCl (SUBOXONE) 8-2 MG FILM Place 0.25 each under the tongue 5 (five) times daily.    [provider]  Camphor-Phenol (CAMPHO-PHENIQUE) 10.8-4.7 % GEL Apply 1 application topically daily as needed (for irritation).    [provider]  cephALEXin (KEFLEX) 500 MG capsule Take 1 capsule (500 mg total) by mouth 4 (four) times daily. 09/25/13   Antonietta Breach, PA-C  Cold Sore Products (BLISTEX EX) Apply 1 application topically daily as needed (for dry lips).    [provider]  Docosanol (ABREVA) 10 % CREA Apply 1 application topically daily as needed (for cold sores).    [provider]  DULoxetine (CYMBALTA) 20 MG capsule Take 20 mg by mouth daily.    [provider]  ibuprofen (ADVIL,MOTRIN) 200 MG tablet Take 400 mg by mouth every 6 (six) hours as needed for moderate pain.    [provider]  lisdexamfetamine (VYVANSE) 50 MG capsule Take 50 mg by mouth daily.    [provider]  mupirocin cream (BACTROBAN) 2 % Apply 1 application topically once.    [provider]  promethazine (PHENERGAN) 25 MG tablet Take 25 mg by mouth every 6 (six) hours as needed for nausea or vomiting.    [provider]    Physical Exam: Vitals:   10/03/18 1945 10/03/18 2000 10/03/18 2015 10/03/18 2030  BP: (!) 123/95 (!) 130/99 124/90 (!) 130/97  Pulse: 80 77 72 76  Resp:      Temp:      TempSrc:      SpO2: 94% 97% 97% 99%  Weight:      Height:          Constitutional: NAD, calm, comfortable Vitals:   10/03/18 1945 10/03/18 2000 10/03/18 2015 10/03/18 2030  BP: (!) 123/95 (!) 130/99 124/90 (!) 130/97  Pulse: 80 77 72 76  Resp:      Temp:      TempSrc:      SpO2: 94%  97% 97% 99%  Weight:      Height:       Eyes: PERRL, lids and conjunctivae normal ENMT: Mucous membranes are moist. Posterior pharynx clear of any exudate or lesions.Normal dentition.  Neck: normal, supple, no masses, no thyromegaly Respiratory: clear to auscultation bilaterally, no wheezing, no crackles. Normal respiratory effort. No accessory muscle use.  Cardiovascular: Regular rate and rhythm, no murmurs / rubs / gallops. No extremity edema. 2+ pedal pulses. No carotid bruits.  Abdomen: mild epigastric tenderness, no masses palpated. No hepatosplenomegaly. Bowel sounds positive.  Musculoskeletal: no clubbing / cyanosis. No joint deformity upper and lower extremities. Good ROM, no contractures. Normal muscle tone.  Skin: no rashes, lesions, ulcers. No induration Neurologic: CN 2-12 grossly intact. Sensation intact, DTR normal. Strength 5/5 in all 4.  Psychiatric: Normal judgment and insight. Alert and oriented x 3. Normal mood.     Labs on Admission: I have personally reviewed following labs and imaging studies  CBC: Recent Labs  Lab 10/03/18 1841  WBC 10.1  NEUTROABS 7.3  HGB 14.5  HCT 44.1  MCV 89.3  PLT 166   Basic Metabolic Panel: Recent Labs  Lab 10/03/18 1841  NA 139  K 3.4*  CL 104  CO2 24  GLUCOSE 123*  BUN 8  CREATININE 0.72  CALCIUM 9.3   GFR: Estimated Creatinine Clearance: 84.3 mL/min (by C-G formula based on SCr of 0.72 mg/dL). Liver Function Tests: Recent Labs  Lab 10/03/18 1841  AST 20  ALT 14  ALKPHOS 56  BILITOT 0.4  PROT 7.7  ALBUMIN 3.9   Recent Labs  Lab 10/03/18 1841  LIPASE 18   No results for input(s): AMMONIA in the last 168 hours. Coagulation Profile: No results for input(s): INR, PROTIME in the last 168 hours. Cardiac Enzymes: No results for input(s): CKTOTAL, CKMB, CKMBINDEX, TROPONINI in the last 168 hours. BNP (last 3 results) No results for input(s): PROBNP in the last 8760 hours. HbA1C: No results for input(s):  HGBA1C in the last 72 hours. CBG: No results for input(s): GLUCAP in the last 168 hours. Lipid Profile: No results for input(s): CHOL, HDL, LDLCALC, TRIG, CHOLHDL, LDLDIRECT in the last 72 hours. Thyroid Function Tests: No results for input(s): TSH, T4TOTAL, FREET4, T3FREE, THYROIDAB in the last 72 hours. Anemia Panel: No results for input(s): VITAMINB12, FOLATE, FERRITIN, TIBC, IRON, RETICCTPCT in the last 72 hours. Urine analysis:    Component Value Date/Time   COLORURINE YELLOW 10/03/2018 2050   APPEARANCEUR HAZY (A) 10/03/2018 2050   LABSPEC 1.025 10/03/2018 2050   PHURINE 5.0 10/03/2018 2050   GLUCOSEU NEGATIVE 10/03/2018 2050   HGBUR SMALL (A) 10/03/2018 2050   BILIRUBINUR NEGATIVE 10/03/2018 2050   BILIRUBINUR 1 02/08/2012 1229   KETONESUR 20 (A) 10/03/2018 2050   PROTEINUR NEGATIVE 10/03/2018 2050   UROBILINOGEN 1.0 09/25/2013 1448   NITRITE NEGATIVE 10/03/2018 2050   LEUKOCYTESUR NEGATIVE 10/03/2018 2050   Sepsis Labs: @LABRCNTIP (procalcitonin:4,lacticidven:4) )No results found for this or any previous visit (from the past 240 hour(s)).   Radiological Exams on Admission: No results found.  Assessment/Plan Principal Problem:   Hematemesis Active Problems:   Drug abuse and dependence ([place])   Hypokalemia   Alcoholism ([place])     #1 hematemesis: Appears to be NSAIDs induced but also patient is alcoholic.  She could have gastritis from both NSAIDs and alcohol, esophagitis also possible.  Ulcers are possible as well.  Will initiate IV Protonix.  Keep patient n.p.o.  IV fluids and the more close monitoring.  GI already consulted.  Serial CBCs to monitor hemoglobin.  Patient may require a EGD.  #2  Hypokalemia: Replete potassium.  #3 alcoholism: Patient takes 1-2 beers a day.  She will be on the watch list for possible DT.  #4 history of polysubstance abuse: Including tobacco.  Add nicotine patch.  Continue close monitoring.  #5  hypertension: Blood pressure is mildly  elevated.  Currently n.p.o.  Will use IV labetalol as needed.  #6 dental pain: Probably an abscess.  She will need to follow-up with a dentist.  We may consider empiric antibiotics while in the hospital.  Pain control with low-dose morphine.   DVT prophylaxis: SCD Code Status: Full code Family Communication: No family at bedside discussed care with the patient therefore Disposition Plan: Home Consults called: Dr. Lucio Edward, gastroenterology Admission status: Inpatient  Severity of Illness: The appropriate patient status for this patient is INPATIENT. Inpatient status is judged to be reasonable and necessary in order to provide the required intensity of service to ensure the patient's safety. The patient's presenting symptoms, physical exam findings, and initial radiographic and laboratory data in the context of their chronic comorbidities is felt to place them at high risk for further clinical deterioration. Furthermore, it is not anticipated that the patient will be medically stable for discharge from the hospital within 2 midnights of admission. The following factors support the patient status of inpatient.   " The patient's presenting symptoms include hematemesis. " The worrisome physical exam findings include mild epigastric tenderness. " The initial radiographic and laboratory data are worrisome because of hypokalemia and stool positive for occult blood. " The chronic co-morbidities include dental pain with alcoholism.   * I certify that at the point of admission it is my clinical judgment that the patient will require inpatient hospital care spanning beyond 2 midnights from the point of admission due to high intensity of service, high risk for further deterioration and high frequency of surveillance required.Barbette Merino MD [facility] Pager [phone]  If 7PM-7AM, please contact night-coverage [email or web address] Password [facility]  10/03/2018, 10:13 PM

## 2018-10-04 ENCOUNTER — Inpatient Hospital Stay (HOSPITAL_COMMUNITY): Payer: Medicaid Other

## 2018-10-04 DIAGNOSIS — K047 Periapical abscess without sinus: Secondary | ICD-10-CM

## 2018-10-04 LAB — CBC
HCT: 36.7 % (ref 36.0–46.0)
HCT: 37 % (ref 36.0–46.0)
Hemoglobin: 12.1 g/dL (ref 12.0–15.0)
Hemoglobin: 12.5 g/dL (ref 12.0–15.0)
MCH: 29.2 pg (ref 26.0–34.0)
MCH: 30.3 pg (ref 26.0–34.0)
MCHC: 33 g/dL (ref 30.0–36.0)
MCHC: 33.8 g/dL (ref 30.0–36.0)
MCV: 88.4 fL (ref 80.0–100.0)
MCV: 89.8 fL (ref 80.0–100.0)
Platelets: 185 10*3/uL (ref 150–400)
Platelets: 183 10*3/uL (ref 150–400)
RBC: 4.15 MIL/uL (ref 3.87–5.11)
RBC: 4.12 MIL/uL (ref 3.87–5.11)
RDW: 12.9 % (ref 11.5–15.5)
RDW: 13.2 % (ref 11.5–15.5)
WBC: 7.9 10*3/uL (ref 4.0–10.5)
WBC: 7.6 10*3/uL (ref 4.0–10.5)
nRBC: 0 % (ref 0.0–0.2)
nRBC: 0 % (ref 0.0–0.2)

## 2018-10-04 LAB — COMPREHENSIVE METABOLIC PANEL
ALT: 10 U/L (ref 0–44)
AST: 15 U/L (ref 15–41)
Albumin: 2.9 g/dL — ABNORMAL LOW (ref 3.5–5.0)
Alkaline Phosphatase: 45 U/L (ref 38–126)
Anion gap: 6 (ref 5–15)
BUN: 7 mg/dL (ref 6–20)
CO2: 23 mmol/L (ref 22–32)
Calcium: 8 mg/dL — ABNORMAL LOW (ref 8.9–10.3)
Chloride: 108 mmol/L (ref 98–111)
Creatinine, Ser: 0.65 mg/dL (ref 0.44–1.00)
GFR calc Af Amer: >60 mL/min (ref >60)
Glucose, Bld: 106 mg/dL — ABNORMAL HIGH (ref 70–99)
Potassium: 3.1 mmol/L — ABNORMAL LOW (ref 3.5–5.1)
Sodium: 137 mmol/L (ref 135–145)
TOTAL PROTEIN: 6 g/dL — AB (ref 6.5–8.1)
Total Bilirubin: 0.6 mg/dL (ref 0.3–1.2)

## 2018-10-04 LAB — HIV ANTIBODY (ROUTINE TESTING W REFLEX): HIV Screen 4th Generation wRfx: NONREACTIVE

## 2018-10-04 MED ORDER — SODIUM CHLORIDE 0.9 % IV SOLN
INTRAVENOUS | Status: DC | PRN
  Administered 2018-10-04 – 2018-10-06 (×6): 250 mL via INTRAVENOUS

## 2018-10-04 MED ORDER — ENSURE ENLIVE PO LIQD
237.0000 mL | Freq: Two times a day (BID) | ORAL | Status: DC
  Administered 2018-10-05 – 2018-10-06 (×4): 237 mL via ORAL

## 2018-10-04 MED ORDER — BUPRENORPHINE HCL-NALOXONE HCL 8-2 MG SL SUBL
1.0000 | SUBLINGUAL_TABLET | Freq: Three times a day (TID) | SUBLINGUAL | Status: DC
  Administered 2018-10-04 – 2018-10-06 (×9): 1 via SUBLINGUAL
  Filled 2018-10-04 (×9): qty 1

## 2018-10-04 MED ORDER — CLINDAMYCIN PHOSPHATE 600 MG/50ML IV SOLN
600.0000 mg | Freq: Three times a day (TID) | INTRAVENOUS | Status: DC
  Administered 2018-10-04 – 2018-10-06 (×6): 600 mg via INTRAVENOUS
  Filled 2018-10-04 (×6): qty 50

## 2018-10-04 NOTE — Consult Note (Signed)
Referring Provider: No ref. provider found Primary Care Physician:  Leonard Downing, MD Primary Gastroenterologist:  Althia Forts   Reason for Consultation: Hematemesis   HPI: Jean [name] is a 38 y.o. female with a medical history significant for hypertension, chronic depression, opioid drug use, past cholecystectomy secondary to gallstones and 3 C sections. She presented to the ED 10/03/2018 with coffee-ground emesis. She developed left lower tooth pain at 11pm 10/02/2018. She took 8 ASA 32m tablets at 3am 10/03/2018 for dental pain. She awakened at 5:30am and began vomiting coffee ground emesis. She reported having epigastric pain which started after vomiting. No lower abdominal pain.  She typically passes a normal brown formed stool once daily. No melena or rectal bleeding. No dysphagia, heartburn. Left dental pain has increased with notable facial swelling. No drooling, she is able to swallow her secretions. She remains drug free, on Suboxone for the past 4 months. She drinks 2 beers daily. No prior history of GIB, GERD or ulcers. Mother died from colon cancer at the age of 640    ED course.  Labs: Sodium 139.  Potassium 3.4.  Glucose 123.  BUN 8.  Creatinine 0.72.  Alk phos 56.  Albumin 3.9.  Lipase 18.  AST 20.  ALT 14.  Total bilirubin 0.4.  WBC 10.1.  Hemoglobin 14.5.  Hematocrit 44.1.  Platelet 204.  Salicylate level 322.0  FOBT positive. Protonix 836mhr infusion started.   Past Medical History:  Diagnosis Date  . Abnormal Pap smear 2008   COLPO DONE; WAS NORMAL;LAST PAP 06/2010 WAS NORMAL  . Anxiety    TAKES ZOLOFT  . Chronic cholecystitis 11/2010  . CIN I (cervical intraepithelial neoplasia I)   . CIN II (cervical intraepithelial neoplasia II)   . Depression    [place]OL[place]FT  [place]   WAS ON METHADONE IN PAST;SUBOXONE  CURRENTLY  . FHx: cancer   . FHx: diabetes mellitus   . FHx: hypertension   . H/O chlamydia infection 2005   WAS TREATED  . H/O  varicella   . Hx: UTI (urinary tract infection)    NOT FREQ  . Pregnancy induced hypertension 2005   WAS GIVEN BP MEDS  WHILE IN HOSPITAL AND [place]  . [place]   [place];D/T medication reaction  . [place] complic[place]  . UTI (lower urinary tract infection)   . Yeast infection    NOT FREQ    Past Surgical History:  Procedure Laterality Date  . CESAREAN SECTION  2005 & 2008  . CESAREAN SECTION WITH BILATERAL TUBAL LIGATION  08/29/2012   Procedure: CESAREAN SECTION WITH BILATERAL TUBAL LIGATION;  Surgeon: AnDelice LeschMD;  [facility];  Service: Obstetrics;  Laterality: Bilateral;  Repeat  . DILATION AND CURETTAGE OF UTERUS    . LAPAROSCOPIC CHOLECYSTECTOMY W/ CHOLANGIOGRAPHY  11/09/2010  . LEEP  06/29/2007  . WISDOM TOOTH EXTRACTION     ALL 4 EXTRACTED    Prior to Admission medications   Medication Sig Start Date End Date Taking? Authorizing Provider  benzocaine (ORAJEL) 10 % mucosal gel Use as directed 1 application in the mouth or throat as needed for mouth pain.   Yes [provider]  Buprenorphine HCl-Naloxone HCl (SUBOXONE) 8-2 MG FILM Place 1 each under the tongue 3 (three) times daily.    Yes [provider]  ibuprofen (ADVIL,MOTRIN) 200 MG tablet Take 400 mg by mouth every 6 (six) hours as  needed for moderate pain.   Yes [provider]  lisdexamfetamine (VYVANSE) 30 MG capsule Take 30 mg by mouth daily.    Yes [provider]    Current Facility-Administered Medications  Medication Dose Route Frequency Provider Last Rate Last Dose  . dextrose 5 % and 0.9 % NaCl with KCl 40 mEq/L infusion   Intravenous Continuous Gala Romney L, MD 100 mL/hr at 10/04/18 0300    . ondansetron (ZOFRAN) tablet 4 mg  4 mg Oral Q6H PRN Elwyn Reach, MD       Or  . ondansetron (ZOFRAN) injection 4 mg  4 mg Intravenous Q6H PRN Gala Romney L, MD      . pantoprazole (PROTONIX) 80 mg  in sodium chloride 0.9 % 250 mL (0.32 mg/mL) infusion  8 mg/hr Intravenous Continuous Gala Romney L, MD 25 mL/hr at 10/04/18 0300 8 mg/hr at 10/04/18 0300  . [START ON 10/07/2018] pantoprazole (PROTONIX) injection 40 mg  40 mg Intravenous Q12H Elwyn Reach, MD        Allergies as of 10/03/2018  . (No Known Allergies)    Family History  Problem Relation Age of Onset  . Hypertension Mother   . Cancer Mother        STAGE IV;RECENT DX  . Hyperlipidemia Mother   . Depression Mother        ON MEDS  . Depression Father        ON MEDS  . Diabetes Maternal Grandmother        IDDM  . Other Maternal Grandmother        VARICOSE VEINS    Social History   Socioeconomic History  . Marital status: Single    Spouse name: Not on file  . Number of children: 2  . Years of education: 36  . Highest education level: Not on file  Occupational History    Employer: UNEMPLOYED  Social Needs  . Financial resource strain: Not on file  . Food insecurity:    Worry: Not on file    Inability: Not on file  . Transportation needs:    Medical: Not on file    Non-medical: Not on file  Tobacco Use  . Smoking status: Current Every Day Smoker    Packs/day: 1.00    Types: Cigarettes  . Smokeless tobacco: Never Used  . Tobacco comment: not ready to quit.  MFM consult feels attempts will exacerbate narcotic substiitiution therapy  Substance and Sexual Activity  . Alcohol use: Yes    Alcohol/week: 4.0 standard drinks    Types: 4 Cans of beer per week  . Drug use: Not on file    Comment: suboxone  . Sexual activity: Yes    Partners: Male    Birth control/protection: None    Comment: pregnant  Lifestyle  . Physical activity:    Days per week: Not on file    Minutes per session: Not on file  . Stress: Not on file  Relationships  . Social connections:    Talks on phone: Not on file    Gets together: Not on file    Attends religious service: Not on file    Active member of club or  organization: Not on file    Attends meetings of clubs or organizations: Not on file    Relationship status: Not on file  . Intimate partner violence:    Fear of current or ex partner: Not on file    Emotionally abused: Not on file  Physically abused: Not on file    Forced sexual activity: Not on file  Other Topics Concern  . Not on file  Social History Narrative   ** Merged History Encounter **        Review of Systems: Gen: Denies any fever, chills, sweats, anorexia, fatigue, weakness, malaise, weight loss, and sleep disorder CV: Denies chest pain or palpitations. Resp: Denies shortness of breath or cough. GI: See HPI GU : Denies urinary burning, blood in urine, urinary frequency, urinary hesitancy, nocturnal urination, and urinary incontinence. MS: Denies joint pain, limitation of movement, and swelling, stiffness, low back pain, extremity pain. Denies muscle weakness, cramps, atrophy.  Derm: Denies rash, itching, dry skin, hives, moles, warts, or unhealing ulcers.  Heme: Denies bruising, bleeding, and enlarged lymph nodes. Neuro:  Denies any headaches, dizziness, paresthesias. Endo:  Denies any problems with DM, thyroid, adrenal function.  Physical Exam: Vital signs in last 24 hours: Temp:  [98.7 F (37.1 C)-98.9 F (37.2 C)] 98.8 F (37.1 C) (02/27 0503) Pulse Rate:  [60-96] 86 (02/27 0503) Resp:  [17-18] 18 (02/27 0503) BP: (122-134)/(88-100) 128/88 (02/27 0503) SpO2:  [94 %-100 %] 99 % (02/27 0503) Weight:  [54 kg-54.4 kg] 54 kg (02/27 0014) Last BM Date: 10/03/18 General:   Alert,  Well-developed, well-nourished, pleasant and cooperative in NAD Mouth: Significant left facial swelling, poor dentition, left buccal mucosa erythematous with gross edema, most likely has abscess Neck:  Supple; no masses or thyromegaly. Lungs:  Clear throughout to auscultation.   No wheezes, crackles, or rhonchi. Heart:  Regular rate and rhythm; no murmurs, clicks, rubs,  or  gallops. Abdomen:  Soft,nontender, BS active,nonpalp mass or hsm.   Rectal:  Deferred  Msk:  Symmetrical without gross deformities. . Pulses:  Normal pulses noted. Extremities:  Without clubbing or edema. Neurologic:  Alert and  oriented x4;  grossly normal neurologically. Skin:  Intact without significant lesions or rashes.. Psych:  Alert and cooperative. Normal mood and affect.  Intake/Output from previous day: 02/26 0701 - 02/27 0700 In: 449.2 [I.V.:449.2] Out: 150 [Urine:150] Intake/Output this shift: No intake/output data recorded.  Lab Results: Recent Labs    10/03/18 1841 10/03/18 2215 10/04/18 0411  WBC 10.1 9.5 7.9  HGB 14.5 12.8 12.1  HCT 44.1 38.7 36.7  PLT 204 194 185   BMET Recent Labs    10/03/18 1841 10/04/18 0411  NA 139 137  K 3.4* 3.1*  CL 104 108  CO2 24 23  GLUCOSE 123* 106*  BUN 8 7  CREATININE 0.72 0.65  CALCIUM 9.3 8.0*   LFT Recent Labs    10/04/18 0411  PROT 6.0*  ALBUMIN 2.9*  AST 15  ALT 10  ALKPHOS 45  BILITOT 0.6   PT/INR Recent Labs    10/03/18 2215  LABPROT 14.0  INR 1.1      IMPRESSION/PLAN   72. 38 year old female with hematemesis after taking excessive ASA for dental pain with developing oral abscess. Will need eventual EGD to check for NSAID ulcers, H. Pylori. Hg 12.8. HCT 38.7. -continue PPI IV -EGD deferred until dental process/oral abscess evaluated by oral surgery, may need I & D. I spoke to Dr. Verlon Au who will prescribe Clindamycin and facilitate dental/oral surgeon consult. Will reconsider earlier EGD if she develops further hematemesis, melena or significant drop in H/H.  -check H. Pylori ab -NPO -IVF   2. History of polysubstance abuse on Suboxone -pain management per hospitalist    3. Left lower dental pain  with developing abscess -refer to #1   Further recommendations per Dr. Corena Pilgrim Dorathy Daft  10/04/2018, 8:21 AM

## 2018-10-04 NOTE — Progress Notes (Signed)
[facility] PROGRESS NOTE  Jean [name] RJJ:[id number] DOB: 02-17-1981 DOA: 10/03/2018 PCP: Leonard Downing, MD   Narrative: 38 year old female Known history of hypertension Polysubstance abuse currently on Suboxone Bipolar Prior seizure? 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  Admitted with 10-12 episodes of vomiting dental pain and had been taking aspirin ibuprofen-came to the ED for the same in the past  Hemoglobin however curiously stable between 12 and 14 without any significant drop  A & Plan Hematemesis Defer to GI further planning including scope continue Protonix for now as 8 mg gtt Cont Dextrose + 40 K at 100 cc/h as npo Dental abscess with cellulitis Placed on clindamycin 600 IV Try to obtain orthopantogram, consult dental surgery regarding the same For pain control we will place back on Suboxone 10 3 times daily although this is pretty high dose and I have asked nursing to confirm this with her pharmacy as I have not been able to locate records on the PDMP Hypokalemia K still slight low 3.1-recheck in am with labs Alcoholism Reports 2-3 etoh drinks a day Monitor and if risk for withdrawal implement CIWA--observe only for now Polysubstance abuse Not currently on Suboxone? HTN mildy elevated only 130--not candidate for RX at this time Mod to severe malnutrition  BMI 17    Gazella Anglin, MD  [facility] [email or web address] 7PM-7AM contact night coverage as above 10/04/2018, 8:40 AM  LOS: 1 day   Consultants:  GI  Dental  Procedures:  none  Antimicrobials:  n  Interval history/Subjective: Awake alert seems to be in some amount of pain but no significant distress Seated in the bed without any specific other issues  Objective:  Vitals:  Vitals:   10/03/18 2345 10/04/18 0503  BP: (!) 125/95 128/88  Pulse: 64 86  Resp: 18  18  Temp: 98.7 F (37.1 C) 98.8 F (37.1 C)  SpO2: 99% 99%    Exam:  EOMI NCAT no distress-I am unable to locate any source of this abscess in the mouth given the brawny edema Chest clear Gross swelling of left side of face-patient states this occurred over 24 hours Abdomen soft nontender no rebound No lower extremity edema   I have personally reviewed the following:  DATA   Labs:  K 3.1, Hemoglobin    14-->12  Imaging studies:  Await orthopantogram  Medical tests:  n   Test discussed with performing physician:  n  Decision to obtain old records:  n  Review and summation of old records:  n  Scheduled Meds: . [START ON 10/07/2018] pantoprazole  40 mg Intravenous Q12H   Continuous Infusions: . dextrose 5 % and 0.9 % NaCl with KCl 40 mEq/L 100 mL/hr at 10/04/18 0300  . pantoprozole (PROTONIX) infusion 8 mg/hr (10/04/18 0300)    Principal Problem:   Hematemesis Active Problems:   Drug abuse and dependence ([place])   Hypokalemia   Alcoholism ([place])   LOS: 1 day

## 2018-10-04 NOTE — Progress Notes (Signed)
Per MD- wanted to call pharmacy to confirm medication dosage.   Per [facility] [phone]) pt takes the Suboxone 8-2 mg 3x daily.   Last refill was 2/11.

## 2018-10-04 NOTE — Progress Notes (Addendum)
Initial Nutrition Assessment  DOCUMENTATION CODES:   Non-severe (moderate) malnutrition in context of social or environmental circumstances  INTERVENTION:  Ensure Enlive po BID, each supplement provides 350 kcal and 20 grams of protein  Snacks, BID between meals, depending on dental consult findings. Pt agreed if tooth removal, soft foods, like jello/pudding.    NUTRITION DIAGNOSIS:   Moderate Malnutrition related to social / environmental circumstances(Post Polysubstance abuse) as evidenced by per patient/family report, moderate fat depletion, moderate muscle depletion.   GOAL:   Patient will meet greater than or equal to 90% of their needs   MONITOR:   Diet advancement, Supplement acceptance, PO intake  REASON FOR ASSESSMENT:   Other (Comment)(Low BMI)    ASSESSMENT:   Pt is 57y F with PMH of HTN, chronic depression, opioid drug use, past cholecystectomy secondary to gallstones and 3 C-sections. Presented to ED with coffee-ground emesis vomiting, with reports taking 8 aspirins/ibuprofen for dental pain with developing oral abscess. Pt now admitted with Hematemesis and Upper GI Bleed.  Pt states that her appetite has been great prior to admission. The day before and day of admission, while nausea and vomiting Pt reports that she had no appetite and didn't want to eat. Now admitted pt reports that she is hungry and wants to eat, however diet is currently NPO. Currently waiting for dental consult and procedures. Pt wasn't forthcoming about number of meals in a day but states that she eats a lot of hotdogs and similar, raman noodles and noodles with broccoli and carrots, and microwave or prepackaged meals throughout the day. Pt likes snacks and candy, like chocolate. Pt doesn't take any supplements nor drink any supplements or meal replacements. Pt is agreeable to adding ensure and snacks once diet is appropriately changed.   Pt endorses there could be some weight loss, stating  that her UBW is 119-126# (0.8-6.3%weight loss), with her current weight being at 118#. She doesn't recount the timeframe stating she doesn't remember her weight since being at the doctor last, but she fluctuates weight. Pt IBW145# (65.9kg).   Pt at baseline is mobile and has no issues getting around. Pt states that she has had her wisdom teeth previously removed. Even with the dental ulcer she hasn't had any issues chewing or swallowing, however, the pain was worsening, leading up to admission.    Scheduled procedures reviewed and include:  EGD deferred until dental process/oral abscess evaluated by oral surgery consult.   Labs reviewed:  Potassium 3.1 (L) Glucose 106 (H)  Medications reviewed and include:  Dextrose 5% and 0.9% NaCl with KCl 40 mEq/L 167mL/hr    NUTRITION - FOCUSED PHYSICAL EXAM:    Most Recent Value  Orbital Region  No depletion  Upper Arm Region  Moderate depletion  Thoracic and Lumbar Region  Moderate depletion  Buccal Region  Mild depletion  Temple Region  Moderate depletion  Clavicle Bone Region  Moderate depletion  Clavicle and Acromion Bone Region  Moderate depletion  Scapular Bone Region  Moderate depletion  Dorsal Hand  Mild depletion  Patellar Region  Moderate depletion  Anterior Thigh Region  Moderate depletion  Posterior Calf Region  Moderate depletion  Edema (RD Assessment)  None  Hair  Reviewed  Eyes  Reviewed  Mouth  Reviewed  Skin  Reviewed  Nails  Reviewed       Diet Order:   Diet Order            DIET SOFT Room service appropriate? Yes; Fluid consistency: Thin  Diet effective now              EDUCATION NEEDS:   No education needs have been identified at this time  Skin:  Skin Assessment: Reviewed RN Assessment  Last BM:  2/26  Height:   Ht Readings from Last 1 Encounters:  10/04/18 5\' 9"  (1.753 m)    Weight:   Wt Readings from Last 1 Encounters:  10/04/18 54 kg    Ideal Body Weight:  65.9 kg  BMI:  Body mass  index is 17.59 kg/m.  Estimated Nutritional Needs:   Kcal:  1900-2200  Protein:  95-110 grams  Fluid:  >2L    Herma Carson, [place] [facility]etic Intern

## 2018-10-04 NOTE — Progress Notes (Signed)
Called  MD- asked about a diet order.    As of now- patient has not seen GI doctor or the Dentist.    MD- ok a soft diet

## 2018-10-05 ENCOUNTER — Encounter (HOSPITAL_COMMUNITY): Payer: Self-pay | Admitting: Dentistry

## 2018-10-05 ENCOUNTER — Inpatient Hospital Stay (HOSPITAL_COMMUNITY): Payer: Medicaid Other

## 2018-10-05 ENCOUNTER — Telehealth: Payer: Self-pay

## 2018-10-05 DIAGNOSIS — E440 Moderate protein-calorie malnutrition: Secondary | ICD-10-CM

## 2018-10-05 LAB — BASIC METABOLIC PANEL
Anion gap: 8 (ref 5–15)
CO2: 24 mmol/L (ref 22–32)
Calcium: 8.1 mg/dL — ABNORMAL LOW (ref 8.9–10.3)
Chloride: 105 mmol/L (ref 98–111)
Creatinine, Ser: 0.59 mg/dL (ref 0.44–1.00)
GFR calc Af Amer: >60 mL/min (ref >60)
GFR calc non Af Amer: >60 mL/min (ref >60)
Glucose, Bld: 107 mg/dL — ABNORMAL HIGH (ref 70–99)
Potassium: 4.1 mmol/L (ref 3.5–5.1)
SODIUM: 137 mmol/L (ref 135–145)

## 2018-10-05 LAB — CBC WITH DIFFERENTIAL/PLATELET
Abs Immature Granulocytes: 0.03 10*3/uL (ref 0.00–0.07)
Basophils Absolute: 0 10*3/uL (ref 0.0–0.1)
Basophils Relative: 0 %
Eosinophils Absolute: 0 10*3/uL (ref 0.0–0.5)
Eosinophils Relative: 1 %
HCT: 33.6 % — ABNORMAL LOW (ref 36.0–46.0)
Hemoglobin: 10.8 g/dL — ABNORMAL LOW (ref 12.0–15.0)
Immature Granulocytes: 1 %
Lymphocytes Relative: 34 %
Lymphs Abs: 2.3 10*3/uL (ref 0.7–4.0)
MCH: 29.2 pg (ref 26.0–34.0)
MCHC: 32.1 g/dL (ref 30.0–36.0)
MCV: 90.8 fL (ref 80.0–100.0)
Monocytes Absolute: 0.7 10*3/uL (ref 0.1–1.0)
Monocytes Relative: 11 %
Neutro Abs: 3.6 10*3/uL (ref 1.7–7.7)
Neutrophils Relative %: 53 %
Platelets: 188 10*3/uL (ref 150–400)
RBC: 3.7 MIL/uL — ABNORMAL LOW (ref 3.87–5.11)
RDW: 13.1 % (ref 11.5–15.5)
WBC: 6.6 10*3/uL (ref 4.0–10.5)
nRBC: 0 % (ref 0.0–0.2)

## 2018-10-05 LAB — H PYLORI, IGM, IGG, IGA AB
H Pylori IgG: 0.36 Index Value (ref 0.00–0.79)
H. Pylogi, Iga Abs: <9 units (ref 0.0–8.9)
H. Pylogi, Igm Abs: <9 units (ref 0.0–8.9)

## 2018-10-05 MED ORDER — PANTOPRAZOLE SODIUM 40 MG PO TBEC
40.0000 mg | DELAYED_RELEASE_TABLET | Freq: Two times a day (BID) | ORAL | Status: DC
  Administered 2018-10-05 – 2018-10-06 (×4): 40 mg via ORAL
  Filled 2018-10-05 (×4): qty 1

## 2018-10-05 MED ORDER — IOHEXOL 300 MG/ML  SOLN
80.0000 mL | Freq: Once | INTRAMUSCULAR | Status: AC | PRN
  Administered 2018-10-05: 80 mL via INTRAVENOUS

## 2018-10-05 NOTE — Progress Notes (Signed)
[facility] PROGRESS NOTE  Jean [name] VXB:[id number] DOB: July 15, 1981 DOA: 10/03/2018 PCP: Leonard Downing, MD   Narrative: 38 year old female Known history of hypertension Polysubstance abuse currently on Suboxone Bipolar Prior seizure? 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 continue Protonix for now as 8 mg gtt-->swithc to 40 bid po as tol po D/c saline Dental abscess with cellulitis Placed on clindamycin 600 IV for now--facial swelling improved resumed Suboxone 10 3 times daily DG Orthopantogram as below--await Dental input Hypokalemia K improved Alcoholism Reports 2-3 etoh drinks a day Monitor and if risk for withdrawal implement CIWA--observe only for now Polysubstance abuse Drinks ETOH Monitor for signs agitation/wihdrawal-so far none HTN mildy elevated only 130--not candidate for RX at this time Mod to severe malnutrition  BMI 17    Jean Bultman, MD  [facility] [address] [email or web address] 7PM-7AM contact night coverage as above 10/05/2018, 1:26 PM  LOS: 2 days   Consultants:  GI  Dental  Procedures:  none  Antimicrobials:  n  Interval history/Subjective:  Overall improved-swelling seems to have recurred to some degree No fever no chills Managing to eat  Objective:  Vitals:  Vitals:   10/05/18 1004 10/05/18 1210  BP: 113/87 113/85  Pulse: 63 68  Resp:    Temp:  98.2 F (36.8 C)  SpO2:  99%    Exam:  EOMI NCAT no distress-swelling to L jaw visibly decreased but still + Chest clear s1 s 2no m/r/g Abdomen soft nontender no  rebound No lower extremity edema   I have personally reviewed the following:  DATA   Labs:  K 3.1-->4.1,   Hemoglobin 14-->12-->10.8  Imaging studies:  Dg orthopantogram 2/27 IMPRESSION: Dental caries.  No acute bony abnormality.  Medical tests:  n   Test discussed with performing physician:  n  Decision to obtain old records:  n  Review and summation of old records:  n  Scheduled Meds: . buprenorphine-naloxone  1 tablet Sublingual TID  . feeding supplement (ENSURE ENLIVE)  237 mL Oral BID BM  . [START ON 10/07/2018] pantoprazole  40 mg Intravenous Q12H   Continuous Infusions: . sodium chloride 250 mL (10/05/18 1016)  . clindamycin (CLEOCIN) IV 600 mg (10/05/18 1018)  . dextrose 5 % and 0.9 % NaCl with KCl 40 mEq/L 100 mL/hr at 10/05/18 0546  . pantoprozole (PROTONIX) infusion 8 mg/hr (10/05/18 0546)    Principal Problem:   Hematemesis Active Problems:   Drug abuse and dependence (HCC)   Hypokalemia   Alcoholism ([place])   Malnutrition of moderate degree   LOS: 2 days

## 2018-10-05 NOTE — Progress Notes (Addendum)
     [facility] Progress Note  CC:  Hematemesis   Subjective: No nausea or vomiting. Swallowing without difficulty. Eating a soft breakfast. Less left facial pain.   Objective:  Vital signs in last 24 hours: Temp:  [98.6 F (37 C)-99.8 F (37.7 C)] 98.6 F (37 C) (02/28 0441) Pulse Rate:  [68-80] 68 (02/28 0441) Resp:  [18] 18 (02/28 0441) BP: (103-129)/(80-86) 103/80 (02/28 0441) SpO2:  [98 %-100 %] 99 % (02/28 0441) Weight:  [55.3 kg] 55.3 kg (02/28 0052) Last BM Date: 10/02/18 General:   Alert,  Well-developed,    in NAD Heart:  Regular rate and rhythm; no murmurs HEENT: gross left facial swelling, buccal mucosa erythematous, tooth pressing into left bucca mucosa, able to open and close jaw without difficulty Pulm: lungs clear throughout.  Abdomen:  Soft, nontender and nondistended. Normal bowel sounds, without guarding, and without rebound.   Extremities:  Without edema. Neurologic:  Alert and  oriented x4;  grossly normal neurologically. Psych:  Alert and cooperative. Normal mood and affect.  Intake/Output from previous day: 02/27 0701 - 02/28 0700 In: 3537.7 [P.O.:357; I.V.:3068.8; IV Piggyback:111.9] Out: 900 [Urine:900] Intake/Output this shift: No intake/output data recorded.  Lab Results: Recent Labs    10/04/18 0411 10/04/18 0911 10/05/18 0508  WBC 7.9 7.6 6.6  HGB 12.1 12.5 10.8*  HCT 36.7 37.0 33.6*  PLT 185 183 188   BMET Recent Labs    10/03/18 1841 10/04/18 0411 10/05/18 0508  NA 139 137 137  K 3.4* 3.1* 4.1  CL 104 108 105  CO2 24 23 24   GLUCOSE 123* 106* 107*  BUN 8 7 <5*  CREATININE 0.72 0.65 0.59  CALCIUM 9.3 8.0* 8.1*   LFT Recent Labs    10/04/18 0411  PROT 6.0*  ALBUMIN 2.9*  AST 15  ALT 10  ALKPHOS 45  BILITOT 0.6   PT/INR Recent Labs    10/03/18 2215  LABPROT 14.0  INR 1.1   Hepatitis Panel Dg Orthopantogram  Result Date: 10/04/2018 CLINICAL DATA:  Vomiting.  Pain and swelling left lower jaw.  EXAM: ORTHOPANTOGRAM/PANORAMIC COMPARISON:  CT 12/05/2005. FINDINGS: Mandible and maxilla appear to be intact. No focal lesions identified. Dental caries appears to be present. Paranasal sinuses appear to be clear. If symptoms persist maxillofacial CT suggested for further evaluation IMPRESSION: Dental caries.  No acute bony abnormality. Electronically Signed   By: Marcello Moores  Register   On: 10/04/2018 11:19    Assessment / Plan:  74. 38 year old female with hematemesis after taking excessive ASA for dental pain with developing oral abscess. Will need eventual EGD to check for NSAID ulcers, H. Pylori. Hg 10.8 down from 12.8 ? Dilutional,l no further hematemesis  -continue to follow H/H -continue antibiotics per hospitalist -await orthopantogram results -await dental surgery consult -will need eventual EGD, most likely as outpatient when oral abscess resolved, however, if hematemesis recurs the EGD would be done asap -switch PPI to po prior to discharge  -No NSAIDS discussed with patient    2. History of polysubstance abuse on Suboxone -pain management per hospitalist   Addendum to Plan: GI to sign off. Recommend Ferrous Sulfate 325mg  one capsule once daily.Repeat CBC in 3 weeks, labs to be completed prior to follow up in outpatient office in 3 weeks with Dr. Rush Landmark or APP to discussed outpatient ED.   Further management per Dr. Rush Landmark   LOS: 2 days   [place]  10/05/2018, 9:58 AM

## 2018-10-05 NOTE — Telephone Encounter (Signed)
-----   Message from Noralyn Pick, NP sent at 10/05/2018  4:42 PM EST ----- Regarding: follow up office visit and labs on Jean [name], can you please schedule patient for a follow up appointment  in 3 weeks with Dr. Rush Landmark, to discuss outpatient EGD.  Pt is not yet discharged from [facility]. Thank you. [employer]

## 2018-10-05 NOTE — Consult Note (Signed)
DENTAL CONSULTATION  Date of Consultation:  10/05/2018 Patient Name:   Jean [name] Date of Birth:   1980/09/10 Medical Record Number: [id number]  VITALS: BP 113/85 (BP Location: Right Arm)   Pulse 68   Temp 98.2 F (36.8 C) (Oral)   Resp 18   Ht 5\' 9"  (1.753 m)   Wt 55.3 kg   LMP 09/25/2018   SpO2 99%   BMI 18.02 kg/m   CHIEF COMPLAINT: Patient referred by Dr. Verlon Au for dental consultation.  HPI: TIFFINIE CAILLIER is a 38 year old female recently admitted with a history of hematemesis.  Patient subsequently developed left facial swelling Thursday morning.  Dental consultation requested today to evaluate dental etiology for the source of the left facial swelling.  Patient currently denies acute toothache with recent administration of IV antibiotic therapy by report.  Patient indicates  that the lower left premolar has been hurting her off and on for the past several weeks.  Patient describes the pain that reached an intensity of 9 out of 10 and is sharp and intermittent in nature.  Pain has been spontaneous at times.  Patient indicates she has had multiple toothaches involving multiple areas of the mouth over the last 2 years.  Patient has not seen a dentist for" a long time" .  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  PROBLEM LIST: Patient Active Problem List   Diagnosis Date Noted  . Malnutrition of moderate degree 10/05/2018  . Hypokalemia 10/03/2018  . Alcoholism ([place]) 10/03/2018  . Hematemesis 10/03/2018  . Umbilical abnormality 23/76/2831  . Fetal abnormality in pregnancy 08/03/2012  . Papanicolaou smear for cervical cancer screening 02/22/2012  . H/O cesarean section 02/19/2012  . Drug abuse and dependence ([place]) 02/19/2012  . Anxiety 02/19/2012  . Depression 02/19/2012  . Hx LEEP (loop electrosurgical excision procedure), cervix, pregnancy 02/19/2012  . Preterm delivery 02/19/2012    PMH: Past Medical History:  Diagnosis Date  . Abnormal Pap smear  2008   COLPO DONE; WAS NORMAL;LAST PAP 06/2010 WAS NORMAL  . Anxiety    TAKES ZOLOFT  . Chronic cholecystitis 11/2010  . CIN I (cervical intraepithelial neoplasia I)   . CIN II (cervical intraepithelial neoplasia II)   . Depression    TAKES ZOLOFT  . Drug abuse ([place])    WAS ON METHADONE IN PAST;SUBOXONE  CURRENTLY  . FHx: cancer   . FHx: diabetes mellitus   . FHx: hypertension   . H/O chlamydia infection 2005   WAS TREATED  . H/O varicella   . Hx: UTI (urinary tract infection)    NOT FREQ  . Pregnancy induced hypertension 2005   WAS GIVEN BP MEDS  WHILE IN HOSPITAL AND PP  . Seizure ([place])    ONLY ONLY ONE;D/T medication reaction  . Suboxone maintenance treatment complicating pregnancy, antepartum ([place])   . UTI (lower urinary tract infection)   . Yeast infection    NOT FREQ    PSH: Past Surgical History:  Procedure Laterality Date  . CESAREAN SECTION  2005 & 2008  . CESAREAN SECTION WITH BILATERAL TUBAL LIGATION  08/29/2012   Procedure: CESAREAN SECTION WITH BILATERAL TUBAL LIGATION;  Surgeon: Delice Lesch, MD;  Location: [facility];  Service: Obstetrics;  Laterality: Bilateral;  Repeat  . DILATION AND CURETTAGE OF UTERUS    . LAPAROSCOPIC CHOLECYSTECTOMY W/ CHOLANGIOGRAPHY  11/09/2010  . LEEP  06/29/2007  . WISDOM TOOTH EXTRACTION     ALL 4 EXTRACTED    ALLERGIES: No Known  Allergies  MEDICATIONS: Current Facility-Administered Medications  Medication Dose Route Frequency Provider Last Rate Last Dose  . 0.9 %  sodium chloride infusion   Intravenous PRN Nita Sells, MD 10 mL/hr at 10/05/18 1016 250 mL at 10/05/18 1016  . buprenorphine-naloxone (SUBOXONE) 8-2 mg per SL tablet 1 tablet  1 tablet Sublingual TID Nita Sells, MD   1 tablet at 10/05/18 1003  . clindamycin (CLEOCIN) IVPB 600 mg  600 mg Intravenous Q8H Samtani, Jai-Gurmukh, MD 100 mL/hr at 10/05/18 1018 600 mg at 10/05/18 1018  . dextrose 5 % and 0.9 % NaCl with KCl 40 mEq/L infusion    Intravenous Continuous Elwyn Reach, MD 100 mL/hr at 10/05/18 0546    . feeding supplement (ENSURE ENLIVE) (ENSURE ENLIVE) liquid 237 mL  237 mL Oral BID BM Nita Sells, MD   237 mL at 10/05/18 1005  . ondansetron (ZOFRAN) tablet 4 mg  4 mg Oral Q6H PRN Elwyn Reach, MD       Or  . ondansetron (ZOFRAN) injection 4 mg  4 mg Intravenous Q6H PRN Gala Romney L, MD      . pantoprazole (PROTONIX) 80 mg in sodium chloride 0.9 % 250 mL (0.32 mg/mL) infusion  8 mg/hr Intravenous Continuous Elwyn Reach, MD 25 mL/hr at 10/05/18 0546 8 mg/hr at 10/05/18 0546  . [START ON 10/07/2018] pantoprazole (PROTONIX) injection 40 mg  40 mg Intravenous Q12H Elwyn Reach, MD        LABS: Lab Results  Component Value Date   WBC 6.6 10/05/2018   HGB 10.8 (L) 10/05/2018   HCT 33.6 (L) 10/05/2018   MCV 90.8 10/05/2018   PLT 188 10/05/2018      Component Value Date/Time   NA 137 10/05/2018 0508   K 4.1 10/05/2018 0508   CL 105 10/05/2018 0508   CO2 24 10/05/2018 0508   GLUCOSE 107 (H) 10/05/2018 0508   BUN <5 (L) 10/05/2018 0508   CREATININE 0.59 10/05/2018 0508   CALCIUM 8.1 (L) 10/05/2018 0508   GFRNONAA >60 10/05/2018 0508   GFRAA >60 10/05/2018 0508   Lab Results  Component Value Date   INR 1.1 10/03/2018   No results found for: PTT  SOCIAL HISTORY: Social History   Socioeconomic History  . Marital status: Single    Spouse name: Not on file  . Number of children: 2  . Years of education: 18  . Highest education level: Not on file  Occupational History    Employer: UNEMPLOYED  Social Needs  . Financial resource strain: Not on file  . Food insecurity:    Worry: Not on file    Inability: Not on file  . Transportation needs:    Medical: Not on file    Non-medical: Not on file  Tobacco Use  . Smoking status: Current Every Day Smoker    Packs/day: 1.00    Types: Cigarettes  . Smokeless tobacco: Never Used  . Tobacco comment: not ready to quit.  MFM consult  feels attempts will exacerbate narcotic substiitiution therapy  Substance and Sexual Activity  . Alcohol use: Yes    Alcohol/week: 4.0 standard drinks    Types: 4 Cans of beer per week  . Drug use: Not on file    Comment: suboxone  . Sexual activity: Yes    Partners: Male    Birth control/protection: None    Comment: pregnant  Lifestyle  . Physical activity:    Days per week: Not on file  Minutes per session: Not on file  . Stress: Not on file  Relationships  . Social connections:    Talks on phone: Not on file    Gets together: Not on file    Attends religious service: Not on file    Active member of club or organization: Not on file    Attends meetings of clubs or organizations: Not on file    Relationship status: Not on file  . Intimate partner violence:    Fear of current or ex partner: Not on file    Emotionally abused: Not on file    Physically abused: Not on file    Forced sexual activity: Not on file  Other Topics Concern  . Not on file  Social History Narrative   ** Merged History Encounter **        FAMILY HISTORY: Family History  Problem Relation Age of Onset  . Hypertension Mother   . Cancer Mother        STAGE IV;RECENT DX  . Hyperlipidemia Mother   . Depression Mother        ON MEDS  . Depression Father        ON MEDS  . Diabetes Maternal Grandmother        IDDM  . Other Maternal Grandmother        VARICOSE VEINS    REVIEW OF SYSTEMS: Reviewed with the patient as per History of present illness. 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    DENTAL HISTORY: CHIEF COMPLAINT: Patient referred by Dr. Verlon Au for dental consultation.  HPI: Jean [name] is a 38 year old female recently admitted with a history of hematemesis.  Patient subsequently developed left facial swelling Thursday morning.  Dental consultation requested today to evaluate dental etiology for the source of the left facial swelling.  Patient currently  denies acute toothache with recent administration of IV antibiotic therapy by report.  Patient indicates  that the lower left premolar has been hurting her off and on for the past several weeks.  Patient describes the pain that reached an intensity of 9 out of 10 and is sharp and intermittent in nature.  Pain has been spontaneous at times.  Patient indicates she has had multiple toothaches involving multiple areas of the mouth over the last 2 years.  Patient has not seen a dentist for" a long time" .  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  DENTAL EXAMINATION: GENERAL: The patient is a well-developed well-nourished female no acute distress. HEAD AND NECK: Patient has left submandibular swelling that is tender to palpation. INTRAORAL EXAM: Patient has normal saliva.  There is left buccal swelling in the area of the left premolar numbers 20 and 21.  Purulent Exudate is noted in this area.  The patient is able to open 30 to 35 mm. DENTITION: Patient with multiple missing teeth of upper and lower molars.  I would need diagnostic orthopantogram to identify the exact tooth numbers present/missing.  Patient does appear to have maxillary right canine that is impacted. PERIODONTAL: The patient has chronic periodontitis with plaque calculus accumulations, generalized gingival recession, generalized tooth mobility.  There is moderate to severe bone loss noted. DENTAL CARIES/SUBOPTIMAL RESTORATIONS: Patient has multiple dental caries noted.  I would need a full series of dental radiographs to identify the extent of the dental caries. ENDODONTIC: The patient has a history of acute pulpitis symptoms.  Patient does appear to have multiple areas of periapical pathology and radiolucency.  Patient appears to have a buccal abscess associated with tooth numbers 20 and 21. CROWN AND BRIDGE: There are no crown or bridge restorations noted. PROSTHODONTIC: Patient denies having partial dentures. OCCLUSION: Patient  has a poor occlusal scheme secondary to multiple missing teeth, deep overbite, lack of replacement of the missing teeth with dental prostheses.  RADIOGRAPHIC INTERPRETATION: Suboptimal orthopantogram was taken by the department of radiology. There are multiple missing teeth.  There are multiple dental caries noted.  There are multiple areas of periapical pathology and radiolucency.  There is an impacted maxillary right canine.  There is moderate to severe bone loss noted.     ASSESSMENTS: 1. Hematemesis-current evaluation by GI 2.  Left facial swelling 3.  Left buccal swelling with purulent exudate involving the areas of tooth numbers 20/21 4.  History of acute pulpitis 5.  Chronic apical periodontitis 6.  Multiple dental caries 7.  Chronic periodontitis of bone loss 8.  Gingival recession 9.  Accretions 10.  Loose teeth 11.  Multiple missing teeth 12.  Impacted tooth #6? 13.  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  History of oral neglect   PLAN/RECOMMENDATIONS: 1. I discussed the risks, benefits, and complications of various treatment options with the patient in relationship to her medical and dental conditions.  The patient currently requires further evaluation by ENT or oral maxillofacial surgery for evaluation for possible incision and drainage procedure.  Patient most likely will need a CT scan of the oral and maxillofacial area to determine need for incision and drainage procedure.   Patient will then need evaluation by an oral surgeon for evaluation for extraction of remaining teeth including the maxillary right impacted canine.  Patient will also need further evaluation by the dentist of her choice for evaluation for recent missing teeth as indicated.  In the meantime the patient needs to be maintained on IV antibiotic therapy with consideration of input from the infectious disease department concerning ideal antibiotic treatment needs.     2. Discussion of  findings with medical team and coordination of future medical and dental care as needed.    Lenn Cal, DDS

## 2018-10-06 DIAGNOSIS — K922 Gastrointestinal hemorrhage, unspecified: Secondary | ICD-10-CM

## 2018-10-06 DIAGNOSIS — D649 Anemia, unspecified: Secondary | ICD-10-CM

## 2018-10-06 DIAGNOSIS — F1990 Other psychoactive substance use, unspecified, uncomplicated: Secondary | ICD-10-CM

## 2018-10-06 LAB — CBC WITH DIFFERENTIAL/PLATELET
Abs Immature Granulocytes: 0.02 10*3/uL (ref 0.00–0.07)
Basophils Absolute: 0 10*3/uL (ref 0.0–0.1)
Basophils Relative: 1 %
EOS ABS: 0.1 10*3/uL (ref 0.0–0.5)
Eosinophils Relative: 2 %
HCT: 36.7 % (ref 36.0–46.0)
Hemoglobin: 12.1 g/dL (ref 12.0–15.0)
IMMATURE GRANULOCYTES: 0 %
Lymphocytes Relative: 36 %
Lymphs Abs: 2.2 10*3/uL (ref 0.7–4.0)
MCH: 29.6 pg (ref 26.0–34.0)
MCHC: 33 g/dL (ref 30.0–36.0)
MCV: 89.7 fL (ref 80.0–100.0)
Monocytes Absolute: 0.5 10*3/uL (ref 0.1–1.0)
Monocytes Relative: 8 %
Neutro Abs: 3.3 10*3/uL (ref 1.7–7.7)
Neutrophils Relative %: 53 %
Platelets: 227 10*3/uL (ref 150–400)
RBC: 4.09 MIL/uL (ref 3.87–5.11)
RDW: 13.2 % (ref 11.5–15.5)
WBC: 6.1 10*3/uL (ref 4.0–10.5)
nRBC: 0 % (ref 0.0–0.2)

## 2018-10-06 LAB — BASIC METABOLIC PANEL
Anion gap: 9 (ref 5–15)
BUN: 13 mg/dL (ref 6–20)
CALCIUM: 9 mg/dL (ref 8.9–10.3)
CO2: 27 mmol/L (ref 22–32)
Chloride: 102 mmol/L (ref 98–111)
Creatinine, Ser: 0.59 mg/dL (ref 0.44–1.00)
GFR calc non Af Amer: >60 mL/min (ref >60)
Glucose, Bld: 92 mg/dL (ref 70–99)
Potassium: 4 mmol/L (ref 3.5–5.1)
Sodium: 138 mmol/L (ref 135–145)

## 2018-10-06 MED ORDER — CLINDAMYCIN HCL 300 MG PO CAPS
300.0000 mg | ORAL_CAPSULE | Freq: Three times a day (TID) | ORAL | Status: DC
  Administered 2018-10-06 – 2018-10-07 (×3): 300 mg via ORAL
  Filled 2018-10-06 (×3): qty 1

## 2018-10-06 NOTE — Progress Notes (Signed)
Patient has been requesting Ensure, vanilla Ensure given.

## 2018-10-06 NOTE — Progress Notes (Signed)
[facility] PROGRESS NOTE  Jean [name] VZS:[id number] DOB: [birth year] DOA: 10/03/2018 PCP: Leonard Downing, MD   Narrative: 38 year old female Known history of hypertension Polysubstance abuse currently on Suboxone Bipolar Prior seizure? 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 Mancel Parsons after his review of CT shows no need for acute surgical resection Hypokalemia K improved Alcoholism Reports 2-3 etoh drinks a day No risk withdrawal Polysubstance abuse Drinks ETOH Monitor for signs agitation/wihdrawal-so far none HTN mildy elevated only 130--not candidate for RX at this time Mod to severe malnutrition  BMI 17    Dove Gresham, MD  [facility] Via [employer] app OR [email or web address] 7PM-7AM contact night coverage as above 10/06/2018, 2:32 PM  LOS: 3 days   Consultants:  GI  Dental  Procedures:  none  Antimicrobials:  n  Interval history/Subjective:  Doing well Tells me wants to go home No cp no fever no chills no rigors  Objective:  Vitals:  Vitals:   10/06/18 0451 10/06/18 1201  BP: 99/70 101/74  Pulse: 73 64  Resp: 18 17  Temp: 98.4 F (36.9 C) 98.5 F (36.9 C)  SpO2: 99% 100%    Exam:  EOMI NCAT no distress-swelling significantly lower than prior Chest clear s1 s 2no m/r/g Abdomen soft nontender no rebound No lower extremity  edema   I have personally reviewed the following:  DATA   Labs:  K 3.1-->4.1,   Hemoglobin 14-->12-->10.8-->6  Imaging studies:  Dg orthopantogram 2/27 IMPRESSION: Dental caries.  No acute bony abnormality.  Medical tests:  n   Test discussed with performing physician:  Yes, d/w Dr. Berniece Andreas follow up patient as OP for extractions  Scheduled Meds: . buprenorphine-naloxone  1 tablet Sublingual TID  . clindamycin  300 mg Oral Q8H  . feeding supplement (ENSURE ENLIVE)  237 mL Oral BID BM  . pantoprazole  40 mg Oral BID   Continuous Infusions: . sodium chloride 250 mL (10/06/18 0934)    Principal Problem:   Hematemesis Active Problems:   Drug abuse and dependence (HCC)   Hypokalemia   Alcoholism ([place])   Malnutrition of moderate degree   LOS: 3 days

## 2018-10-06 NOTE — Plan of Care (Signed)
  Problem: Health Behavior/Discharge Planning: Goal: Ability to manage health-related needs will improve Outcome: Adequate for Discharge   

## 2018-10-07 LAB — CBC WITH DIFFERENTIAL/PLATELET
Abs Immature Granulocytes: 0.02 10*3/uL (ref 0.00–0.07)
Basophils Absolute: 0.1 10*3/uL (ref 0.0–0.1)
Basophils Relative: 1 %
Eosinophils Absolute: 0.2 10*3/uL (ref 0.0–0.5)
Eosinophils Relative: 3 %
HCT: 33.4 % — ABNORMAL LOW (ref 36.0–46.0)
Hemoglobin: 11.1 g/dL — ABNORMAL LOW (ref 12.0–15.0)
Immature Granulocytes: 0 %
LYMPHS ABS: 2.7 10*3/uL (ref 0.7–4.0)
Lymphocytes Relative: 51 %
MCH: 29.8 pg (ref 26.0–34.0)
MCHC: 33.2 g/dL (ref 30.0–36.0)
MCV: 89.8 fL (ref 80.0–100.0)
Monocytes Absolute: 0.4 10*3/uL (ref 0.1–1.0)
Monocytes Relative: 8 %
Neutro Abs: 1.9 10*3/uL (ref 1.7–7.7)
Neutrophils Relative %: 37 %
PLATELETS: 238 10*3/uL (ref 150–400)
RBC: 3.72 MIL/uL — ABNORMAL LOW (ref 3.87–5.11)
RDW: 13.1 % (ref 11.5–15.5)
WBC: 5.2 10*3/uL (ref 4.0–10.5)
nRBC: 0 % (ref 0.0–0.2)

## 2018-10-07 MED ORDER — CLINDAMYCIN HCL 300 MG PO CAPS: 300.0000 mg | ORAL_CAPSULE | Freq: Three times a day (TID) | ORAL | 0 refills | Status: AC

## 2018-10-07 MED ORDER — PANTOPRAZOLE SODIUM 40 MG PO TBEC: 40.0000 mg | DELAYED_RELEASE_TABLET | Freq: Two times a day (BID) | ORAL | 0 refills | Status: AC

## 2018-10-07 NOTE — Discharge Summary (Signed)
Physician Discharge Summary  Jean [name] ZOX:[id number] DOB: 19-Nov-1980 DOA: 10/03/2018  PCP: Leonard Downing, MD  Admit date: 10/03/2018 Discharge date: 10/07/2018  Time spent: 35 minutes  Recommendations for Outpatient Follow-up:  1. Needs outpatient follow-up with gastroenterology for possible scope 2. Needs outpatient dental follow-up-name of dentist provided on AVS 3. Continue clindamycin and given medication to complete 14 days in total treatment 4. No controlled substances prescribed on discharge  Discharge Diagnoses:  Principal Problem:   Hematemesis Active Problems:   Drug abuse and dependence ([place])   Hypokalemia   Alcoholism ([place])   Malnutrition of moderate degree   Discharge Condition: Improved  Diet recommendation: Heart healthy  Filed Weights   10/05/18 0052 10/06/18 0451 10/07/18 0502  Weight: 55.3 kg 55.2 kg 55.1 kg    History of present illness:  38 year old female Known history of hypertension Polysubstance abuse currently on Suboxone Bipolar Prior seizure? 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  Admitted with 10-12 episodes of vomiting dental pain and had been taking aspirin ibuprofen-came to the ED for the same in the past  Hemoglobin however curiously stable between 12 and 14 without any significant drop  Hospital Course:   Hematemesis Appears to have resolved-GI considering OP scope initally Protonix 8g gtt-->switch to 40 bid po as tol po Prescription given on discharge Advised to hold nonsteroidals and follow-up with GI as an outpatient has been arranged they will call her Dental abscess with cellulitis change clindamycin 600 IV-->300 q8, stop date 3/10 resumed Suboxone 10 3 times daily for pain control DG Orthopantogram as below--will follow up as OP with Dr. Mancel Parsons after his review of CT shows no need for acute surgical resection-name  placed on AVS for follow-up Hypokalemia K improved-needs outpatient screening labs Alcoholism Reports 2-3 etoh drinks a day No risk withdrawal Polysubstance abuse On Suboxone 3 times daily and will get supply as an outpatient HTN mildy elevated only 130--not candidate for RX at this time Mod to severe malnutrition             BMI 17  Imaging studies:  Dg orthopantogram 2/27 IMPRESSION: Dental caries. No acute bony abnormality.  CT maxillofacial IMPRESSION: Left facial cellulitis/phlegmonous inflammation. No evidence of a discrete low-density drainable collection. The etiology is quite likely dental/periodontal, with the most suspicious abnormality being a radicular cyst associated with the left mandibular tooth 20, which could possibly decompress through the mental foramen.    Medical tests:  n   Test discussed with performing physician:  Yes, d/w Dr. Berniece Andreas follow up patient as OP for extractions  Discharge Exam: Vitals:   10/06/18 2014 10/07/18 0502  BP: 110/76 110/78  Pulse: 64 (!) 54  Resp: 18 18  Temp: 98.3 F (36.8 C) 97.8 F (36.6 C)  SpO2: 100% 100%    General: Awake alert pleasant no distress EOMI NCAT poor dentition noted lower left jaw Cardiovascular: S1-S2 no murmur rub or gallop Respiratory: Clinically clear no added sound Abdomen soft nontender No lower extremity edema Neurologically intact  Discharge Instructions   Discharge Instructions    Diet - low sodium heart healthy   Complete by:  As directed    Discharge instructions   Complete by:  As directed    Patient advised to continue clindamycin and follow-up with Dr. Erskin Burnet of dental at the earliest for extraction of teeth Gastroenterology already coordinating follow-up and they will call you Please continue all your other  medications in addition to Protonix which we have prescribed   Increase activity slowly   Complete by:  As directed      Allergies as of 10/07/2018   No  Known Allergies     Medication List    STOP taking these medications   ibuprofen 200 MG tablet Commonly known as:  ADVIL,MOTRIN     TAKE these medications   benzocaine 10 % mucosal gel Commonly known as:  ORAJEL Use as directed 1 application in the mouth or throat as needed for mouth pain.   clindamycin 300 MG capsule Commonly known as:  CLEOCIN Take 1 capsule (300 mg total) by mouth every 8 (eight) hours for 9 days.   lisdexamfetamine 30 MG capsule Commonly known as:  VYVANSE Take 30 mg by mouth daily.   pantoprazole 40 MG tablet Commonly known as:  PROTONIX Take 1 tablet (40 mg total) by mouth 2 (two) times daily.   SUBOXONE 8-2 MG Film Generic drug:  Buprenorphine HCl-Naloxone HCl Place 1 each under the tongue 3 (three) times daily.      No Known Allergies Follow-up Information    Drab, Larkin Ina, DMD. Call in 4 day(s).   Specialty:  Dentistry Contact information: [place] [address] [phone]            The results of significant diagnostics from this hospitalization (including imaging, microbiology, ancillary and laboratory) are listed below for reference.    Significant Diagnostic Studies: Dg Orthopantogram  Result Date: 10/04/2018 CLINICAL DATA:  Vomiting.  Pain and swelling left lower jaw. EXAM: ORTHOPANTOGRAM/PANORAMIC COMPARISON:  CT 12/05/2005. FINDINGS: Mandible and maxilla appear to be intact. No focal lesions identified. Dental caries appears to be present. Paranasal sinuses appear to be clear. If symptoms persist maxillofacial CT suggested for further evaluation IMPRESSION: Dental caries.  No acute bony abnormality. Electronically Signed   By: Marcello Moores  Register   On: 10/04/2018 11:19   Ct Maxillofacial W Contrast  Result Date: 10/05/2018 CLINICAL DATA:  Left-sided face and jaw swelling. Advanced dental disease. EXAM: CT MAXILLOFACIAL WITH CONTRAST TECHNIQUE: Multidetector CT imaging of the maxillofacial structures was  performed with intravenous contrast. Multiplanar CT image reconstructions were also generated. CONTRAST:  61mL OMNIPAQUE IOHEXOL 300 MG/ML  SOLN COMPARISON:  Panorex done yesterday.  CT 12/05/2005. FINDINGS: Osseous: Residual dentition shows advanced dental decay with multiple caries and periodontal disease, with lucency around the roots and small radicular cysts. Most prominent radicular cyst affects left mandibular tooth twenty. The lucency may be in contact with the mental foramen which could provide decompression. See below. No other primary bone finding. No that there is an anomalous or unerupted incisor in the right maxilla. Orbits: Normal Sinuses: Clear Soft tissues: Nonspecific soft tissue swelling of the left face consistent with diffuse cellulitis. No evidence of a clearly defined nonenhancing fluid collection to suggest drainable abscess. Limited intracranial: Negative IMPRESSION: Left facial cellulitis/phlegmonous inflammation. No evidence of a discrete low-density drainable collection. The etiology is quite likely dental/periodontal, with the most suspicious abnormality being a radicular cyst associated with the left mandibular tooth 20, which could possibly decompress through the mental foramen. Electronically Signed   By: Nelson Chimes M.D.   On: 10/05/2018 16:03    Microbiology: No results found for this or any previous visit (from the past 240 hour(s)).   Labs: Basic Metabolic Panel: Recent Labs  Lab 10/03/18 1841 10/04/18 0411 10/05/18 0508 10/06/18 0746  NA 139 137 137 138  K 3.4* 3.1* 4.1  4.0  CL 104 108 105 102  CO2 24 23 24 27   GLUCOSE 123* 106* 107* 92  BUN 8 7 <5* 13  CREATININE 0.72 0.65 0.59 0.59  CALCIUM 9.3 8.0* 8.1* 9.0   Liver Function Tests: Recent Labs  Lab 10/03/18 1841 10/04/18 0411  AST 20 15  ALT 14 10  ALKPHOS 56 45  BILITOT 0.4 0.6  PROT 7.7 6.0*  ALBUMIN 3.9 2.9*   Recent Labs  Lab 10/03/18 1841  LIPASE 18   No results for input(s):  AMMONIA in the last 168 hours. CBC: Recent Labs  Lab 10/03/18 1841  10/04/18 0411 10/04/18 0911 10/05/18 0508 10/06/18 0746 10/07/18 0358  WBC 10.1   < > 7.9 7.6 6.6 6.1 5.2  NEUTROABS 7.3  --   --   --  3.6 3.3 1.9  HGB 14.5   < > 12.1 12.5 10.8* 12.1 11.1*  HCT 44.1   < > 36.7 37.0 33.6* 36.7 33.4*  MCV 89.3   < > 88.4 89.8 90.8 89.7 89.8  PLT 204   < > 185 183 188 227 238   < > = values in this interval not displayed.   Cardiac Enzymes: No results for input(s): CKTOTAL, CKMB, CKMBINDEX, TROPONINI in the last 168 hours. BNP: BNP (last 3 results) No results for input(s): BNP in the last 8760 hours.  ProBNP (last 3 results) No results for input(s): PROBNP in the last 8760 hours.  CBG: No results for input(s): GLUCAP in the last 168 hours.     Signed:  Nita Sells MD   [facility] 10/07/2018, 8:07 AM

## 2018-10-07 NOTE — Progress Notes (Addendum)
Pt discharge instructions reviewed with pt. Pt verbalizes understanding and states she has no questions. Pt belongings with pt. Pt is not in distress. Pt refuses wheelchair. Pt's family is driving her home. Prescriptions given to pt.

## 2018-10-08 NOTE — Telephone Encounter (Signed)
appt made to see Jean [name] on 3/27 at 830 am.  Letter mailed to the pt

## 2018-10-08 NOTE — Telephone Encounter (Signed)
Jean [name] can I schedule with app, Dr Rush Landmark does not have any available appts until middle of April?

## 2018-11-02 ENCOUNTER — Other Ambulatory Visit: Payer: Self-pay

## 2018-11-02 ENCOUNTER — Ambulatory Visit (INDEPENDENT_AMBULATORY_CARE_PROVIDER_SITE_OTHER): Payer: Medicaid Other | Admitting: Physician Assistant

## 2018-11-02 DIAGNOSIS — K920 Hematemesis: Secondary | ICD-10-CM

## 2018-11-02 NOTE — Progress Notes (Signed)
Tried contacting the patient today in regards to follow up visit. She was not available. Did LMOM to our call our clinic back to set up foolow up [facility] appt in the next 1-2 weeks.  Ellouise Newer, PA-C

## 2019-02-27 ENCOUNTER — Encounter (HOSPITAL_COMMUNITY): Payer: Self-pay | Admitting: Emergency Medicine

## 2019-02-27 ENCOUNTER — Emergency Department (HOSPITAL_COMMUNITY)
Admission: EM | Admit: 2019-02-27 | Discharge: 2019-02-27 | Disposition: A | Discharge status: Home / Self Care | Payer: Medicaid Other | Attending: Emergency Medicine | Admitting: Emergency Medicine

## 2019-02-27 ENCOUNTER — Other Ambulatory Visit: Payer: Self-pay

## 2019-02-27 DIAGNOSIS — F17210 Nicotine dependence, cigarettes, uncomplicated: Secondary | ICD-10-CM | POA: Insufficient documentation

## 2019-02-27 DIAGNOSIS — K047 Periapical abscess without sinus: Secondary | ICD-10-CM | POA: Insufficient documentation

## 2019-02-27 DIAGNOSIS — K0889 Other specified disorders of teeth and supporting structures: Secondary | ICD-10-CM | POA: Diagnosis present

## 2019-02-27 DIAGNOSIS — Z79899 Other long term (current) drug therapy: Secondary | ICD-10-CM | POA: Diagnosis not present

## 2019-02-27 MED ORDER — TRAMADOL HCL 50 MG PO TABS: 50.0000 mg | ORAL_TABLET | Freq: Four times a day (QID) | ORAL | 0 refills | Status: AC | PRN

## 2019-02-27 MED ORDER — IBUPROFEN 800 MG PO TABS: 800.0000 mg | ORAL_TABLET | Freq: Three times a day (TID) | ORAL | 0 refills | Status: AC | PRN

## 2019-02-27 MED ORDER — PENICILLIN V POTASSIUM 500 MG PO TABS: 500.0000 mg | ORAL_TABLET | Freq: Four times a day (QID) | ORAL | 0 refills | Status: AC

## 2019-02-27 NOTE — Discharge Instructions (Signed)
Return here as needed.  Follow-up with the oral surgeon provided or your own dentist.  Rinse with warm water and peroxide 3 times a day.

## 2019-02-27 NOTE — ED Triage Notes (Signed)
Pt reports dental pain with abscessed tooth to L side of face. Pt has swelling to L lower jaw. Pt reports unable to see a dentist, does not currently have one.

## 2019-03-03 NOTE — ED Provider Notes (Signed)
[facility] EMERGENCY DEPARTMENT Provider Note   CSN: [id number] Arrival date & time: 02/27/19  2007     History   Chief Complaint Chief Complaint  Patient presents with  . Dental Pain    HPI Jean [name] is a 38 y.o. female.     HPI Patient presents to the emergency department with ankle abscess that started 3 days ago.  The patient states she has very poor dentition and has had this happen in the past.  The patient states that she did not take any medications prior to arrival for her symptoms.  Patient states that the pain is gotten worse over that timeframe.  Patient advised she had called several dentist but does not have an appointment for several weeks.  Patient denies fever, nausea, vomiting, weakness, dizziness, headache, blurred vision, throat swelling, difficulty swallowing or syncope. Past Medical History:  Diagnosis Date  . Abnormal Pap smear 2008   COLPO DONE; WAS NORMAL;LAST PAP 06/2010 WAS NORMAL  . Anxiety    TAKES ZOLOFT  . Chronic cholecystitis 11/2010  . CIN I (cervical intraepithelial neoplasia I)   . CIN II (cervical intraepithelial neoplasia II)   . Depression    TAKES ZOLOFT  . Drug abuse ([place])    WAS ON METHADONE IN PAST;SUBOXONE  CURRENTLY  . FHx: cancer   . FHx: diabetes mellitus   . FHx: hypertension   . H/O chlamydia infection 2005   WAS TREATED  . H/O varicella   . Hx: UTI (urinary tract infection)    NOT FREQ  . Pregnancy induced hypertension 2005   WAS GIVEN BP MEDS  WHILE IN HOSPITAL AND PP  . Seizure ([place])    ONLY ONLY ONE;D/T medication reaction  . Suboxone maintenance treatment complicating pregnancy, antepartum ([place])   . UTI (lower urinary tract infection)   . Yeast infection    NOT FREQ    Patient Active Problem List   Diagnosis Date Noted  . Malnutrition of moderate degree 10/05/2018  . Hypokalemia 10/03/2018  . Alcoholism ([place]) 10/03/2018  . Hematemesis 10/03/2018  . Umbilical abnormality 19/50/9326   . Fetal abnormality in pregnancy 08/03/2012  . Papanicolaou smear for cervical cancer screening 02/22/2012  . H/O cesarean section 02/19/2012  . Drug abuse and dependence ([place]) 02/19/2012  . Anxiety 02/19/2012  . Depression 02/19/2012  . Hx LEEP (loop electrosurgical excision procedure), cervix, pregnancy 02/19/2012  . Preterm delivery 02/19/2012    Past Surgical History:  Procedure Laterality Date  . CESAREAN SECTION  2005 & 2008  . CESAREAN SECTION WITH BILATERAL TUBAL LIGATION  08/29/2012   Procedure: CESAREAN SECTION WITH BILATERAL TUBAL LIGATION;  Surgeon: Delice Lesch, MD;  Location: [facility];  Service: Obstetrics;  Laterality: Bilateral;  Repeat  . DILATION AND CURETTAGE OF UTERUS    . LAPAROSCOPIC CHOLECYSTECTOMY W/ CHOLANGIOGRAPHY  11/09/2010  . LEEP  06/29/2007  . WISDOM TOOTH EXTRACTION     ALL 4 EXTRACTED     OB History    Gravida  5   Para  4   Term  3   Preterm  1   AB  1   Living  4     SAB  0   TAB  1   Ectopic  0   Multiple      Live Births  4            Home Medications    Prior to Admission medications   Medication Sig Start Date End Date  Taking? Authorizing Provider  benzocaine (ORAJEL) 10 % mucosal gel Use as directed 1 application in the mouth or throat as needed for mouth pain.    [provider]  Buprenorphine HCl-Naloxone HCl (SUBOXONE) 8-2 MG FILM Place 1 each under the tongue 3 (three) times daily.     [provider]  ibuprofen (ADVIL) 800 MG tablet Take 1 tablet (800 mg total) by mouth every 8 (eight) hours as needed. 02/27/19   Ellijah Leffel, Harrell Gave, PA-C  lisdexamfetamine (VYVANSE) 30 MG capsule Take 30 mg by mouth daily.     [provider]  pantoprazole (PROTONIX) 40 MG tablet Take 1 tablet (40 mg total) by mouth 2 (two) times daily. 10/07/18   Nita Sells, MD  penicillin v potassium (VEETID) 500 MG tablet Take 1 tablet (500 mg total) by mouth 4 (four) times daily. 02/27/19   Aaryanna Hyden,  Harrell Gave, PA-C  traMADol (ULTRAM) 50 MG tablet Take 1 tablet (50 mg total) by mouth every 6 (six) hours as needed for severe pain. 02/27/19   Dalia Heading, PA-C    Family History Family History  Problem Relation Age of Onset  . Hypertension Mother   . Cancer Mother        STAGE IV;RECENT DX  . Hyperlipidemia Mother   . Depression Mother        ON MEDS  . Depression Father        ON MEDS  . Diabetes Maternal Grandmother        IDDM  . Other Maternal Grandmother        VARICOSE VEINS    Social History Social History   Tobacco Use  . Smoking status: Current Every Day Smoker    Packs/day: 1.00    Types: Cigarettes  . Smokeless tobacco: Never Used  . Tobacco comment: not ready to quit.  MFM consult feels attempts will exacerbate narcotic substiitiution therapy  Substance Use Topics  . Alcohol use: Yes    Alcohol/week: 4.0 standard drinks    Types: 4 Cans of beer per week  . Drug use: Not on file    Comment: suboxone     Allergies   Patient has no known allergies.   Review of Systems Review of Systems All other systems negative except as documented in the HPI. All pertinent positives and negatives as reviewed in the HPI. Physical Exam Updated Vital Signs BP (!) 118/93 (BP Location: Left Arm)   Pulse (!) 128   Temp 99 F (37.2 C) (Oral)   Resp 18   SpO2 100%   Physical Exam Vitals signs and nursing note reviewed.  Constitutional:      General: She is not in acute distress.    Appearance: She is well-developed.  HENT:     Head: Normocephalic and atraumatic.     Mouth/Throat:   Eyes:     Pupils: Pupils are equal, round, and reactive to light.  Pulmonary:     Effort: Pulmonary effort is normal.  Skin:    General: Skin is warm and dry.  Neurological:     Mental Status: She is alert and oriented to person, place, and time.      ED Treatments / Results  Labs (all labs ordered are listed, but only abnormal results are displayed) Labs Reviewed  - No data to display  EKG None  Radiology No results found.  Procedures Procedures (including critical care time)  Medications Ordered in ED Medications - No data to display   Initial Impression / Assessment and  Plan / ED Course  I have reviewed the triage vital signs and the nursing notes.  Pertinent labs & imaging results that were available during my care of the patient were reviewed by me and considered in my medical decision making (see chart for details).        Patient will be placed on antibiotics referred to dentistry.  Patient does not have an abscess that I feel comfortable draining at this point.  There is obviously an abscess present.  Patient is advised to return here as needed.  Told to rinse with warm water and peroxide 3 times a day.  Final Clinical Impressions(s) / ED Diagnoses   Final diagnoses:  Dental infection    ED Discharge Orders         Ordered    traMADol (ULTRAM) 50 MG tablet  Every 6 hours PRN     02/27/19 2329    penicillin v potassium (VEETID) 500 MG tablet  4 times daily     02/27/19 2329    ibuprofen (ADVIL) 800 MG tablet  Every 8 hours PRN     02/27/19 2329           Dalia Heading, PA-C 03/03/19 [place], [place], DO 03/07/19 [phone]
